# Patient Record
Sex: Female | Born: 1985 | Race: White | Hispanic: No | Marital: Married | Smoking: Current every day smoker
Health system: Southern US, Community
[De-identification: ages and names within clinical notes are randomized; demographics above are authoritative.]

## PROBLEM LIST (undated history)

## (undated) DIAGNOSIS — D649 Anemia, unspecified: Secondary | ICD-10-CM

## (undated) DIAGNOSIS — B977 Papillomavirus as the cause of diseases classified elsewhere: Secondary | ICD-10-CM

## (undated) DIAGNOSIS — M25519 Pain in unspecified shoulder: Secondary | ICD-10-CM

## (undated) DIAGNOSIS — C801 Malignant (primary) neoplasm, unspecified: Secondary | ICD-10-CM

## (undated) DIAGNOSIS — F419 Anxiety disorder, unspecified: Secondary | ICD-10-CM

## (undated) DIAGNOSIS — F99 Mental disorder, not otherwise specified: Secondary | ICD-10-CM

## (undated) HISTORY — PX: TUBAL LIGATION: SHX77

## (undated) HISTORY — DX: Papillomavirus as the cause of diseases classified elsewhere: B97.7

## (undated) HISTORY — DX: Anemia, unspecified: D64.9

## (undated) HISTORY — DX: Anxiety disorder, unspecified: F41.9

## (undated) HISTORY — DX: Mental disorder, not otherwise specified: F99

## (undated) HISTORY — DX: Malignant (primary) neoplasm, unspecified: C80.1

## (undated) HISTORY — DX: Pain in unspecified shoulder: M25.519

## (undated) HISTORY — PX: TOE SURGERY: SHX1073

---
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
## 2003-01-10 ENCOUNTER — Encounter: Payer: Self-pay | Admitting: Family Medicine

## 2003-01-10 ENCOUNTER — Ambulatory Visit (HOSPITAL_COMMUNITY): Admission: RE | Admit: 2003-01-10 | Discharge: 2003-01-10 | Payer: Self-pay | Admitting: Family Medicine

## 2003-04-22 ENCOUNTER — Inpatient Hospital Stay (HOSPITAL_COMMUNITY): Admission: AD | Admit: 2003-04-22 | Discharge: 2003-04-22 | Payer: Self-pay | Admitting: Family Medicine

## 2003-04-28 ENCOUNTER — Inpatient Hospital Stay (HOSPITAL_COMMUNITY): Admission: AD | Admit: 2003-04-28 | Discharge: 2003-04-28 | Payer: Self-pay | Admitting: Family Medicine

## 2003-05-07 ENCOUNTER — Inpatient Hospital Stay (HOSPITAL_COMMUNITY): Admission: AD | Admit: 2003-05-07 | Discharge: 2003-05-12 | Payer: Self-pay | Admitting: Family Medicine

## 2003-05-08 ENCOUNTER — Encounter: Payer: Self-pay | Admitting: Family Medicine

## 2004-01-04 ENCOUNTER — Other Ambulatory Visit: Admission: RE | Admit: 2004-01-04 | Discharge: 2004-01-04 | Payer: Self-pay | Admitting: Family Medicine

## 2004-08-06 ENCOUNTER — Emergency Department (HOSPITAL_COMMUNITY): Admission: EM | Admit: 2004-08-06 | Discharge: 2004-08-06 | Payer: Self-pay | Admitting: Emergency Medicine

## 2008-12-11 ENCOUNTER — Encounter: Admission: RE | Admit: 2008-12-11 | Discharge: 2008-12-13 | Payer: Self-pay | Admitting: Family Medicine

## 2008-12-14 ENCOUNTER — Encounter: Admission: RE | Admit: 2008-12-14 | Discharge: 2009-03-14 | Payer: Self-pay | Admitting: Family Medicine

## 2009-01-24 ENCOUNTER — Ambulatory Visit (HOSPITAL_COMMUNITY): Admission: RE | Admit: 2009-01-24 | Discharge: 2009-01-24 | Payer: Self-pay | Admitting: Family Medicine

## 2009-02-22 ENCOUNTER — Ambulatory Visit (HOSPITAL_COMMUNITY): Admission: RE | Admit: 2009-02-22 | Discharge: 2009-02-22 | Payer: Self-pay | Admitting: Family Medicine

## 2009-09-12 ENCOUNTER — Emergency Department (HOSPITAL_COMMUNITY): Admission: EM | Admit: 2009-09-12 | Discharge: 2009-09-12 | Payer: Self-pay | Admitting: Emergency Medicine

## 2011-02-04 ENCOUNTER — Emergency Department (HOSPITAL_COMMUNITY)
Admission: EM | Admit: 2011-02-04 | Discharge: 2011-02-04 | Disposition: A | Discharge status: Home / Self Care | Payer: Self-pay | Attending: Emergency Medicine | Admitting: Emergency Medicine

## 2011-02-04 ENCOUNTER — Emergency Department (HOSPITAL_COMMUNITY): Payer: Self-pay

## 2011-02-04 DIAGNOSIS — W108XXA Fall (on) (from) other stairs and steps, initial encounter: Secondary | ICD-10-CM | POA: Insufficient documentation

## 2011-02-04 DIAGNOSIS — S40019A Contusion of unspecified shoulder, initial encounter: Secondary | ICD-10-CM | POA: Insufficient documentation

## 2011-02-04 DIAGNOSIS — Y92009 Unspecified place in unspecified non-institutional (private) residence as the place of occurrence of the external cause: Secondary | ICD-10-CM | POA: Insufficient documentation

## 2011-03-20 LAB — URINALYSIS, ROUTINE W REFLEX MICROSCOPIC
Hgb urine dipstick: NEGATIVE
Nitrite: NEGATIVE
Specific Gravity, Urine: 1.037 — ABNORMAL HIGH (ref 1.005–1.030)
Urobilinogen, UA: 2 mg/dL — ABNORMAL HIGH (ref 0.0–1.0)

## 2011-03-20 LAB — URINE MICROSCOPIC-ADD ON

## 2011-03-20 LAB — URINE CULTURE
Colony Count: NO GROWTH
Culture: NO GROWTH

## 2011-05-01 NOTE — Discharge Summary (Signed)
NAME:  Mandy Garcia, PAZ                  ACCOUNT NO.:  [id number]   MEDICAL RECORD NO.:  [id number]                   PATIENT TYPE:  INP   LOCATION:  9374                                 FACILITY:  [facility]   PHYSICIAN:  Magnus Sinning. Rice, M.D.              DATE OF BIRTH:  1986-11-26   DATE OF ADMISSION:  05/07/2003  DATE OF DISCHARGE:  05/12/2003                                 DISCHARGE SUMMARY   DISCHARGE DIAGNOSES:  1. Intrauterine pregnancy at 35 and six weeks, delivered.  2. Preeclampsia.  3. Spontaneous rupture of membranes.   DISCHARGE MEDICATIONS:  1. Ibuprofen 800 mg one p.o. t.i.d. with food #30 dispensed.  2. Ortho Tri-Cyclen start on Sunday, May 20, 2003.  3. Iron sulfate 325 mg one p.o. b.i.d. until postpartum checkup.   DISCHARGE INSTRUCTIONS:  1. The patient discharged to home with follow up at [facility] within the next four to five days and at six weeks     postpartum.  2. The patient instructed to call for headache, right upper quadrant pain,     or increased swelling.   HISTORY AND PHYSICAL:  This is a 25 year old G2 P1 at 35 weeks and three  days by an eight-week ultrasound who was seen in the office for a routine  visit and noted to have a nine-pound weight gain in the last 10 days with  bilateral ankle swelling.  Denied headache or right upper quadrant pain.  Urine showed no proteinuria.  The patient called with increased swelling  later in the day and was directed to the hospital where she was then  admitted.  This pregnancy was complicated by preterm contractions prior to  this admission but no significant cervical change.  Prenatal labs:  A  positive, antibody negative, RPR nonreactive, rubella immune, HB surface  antigen negative, HIV negative, Pap within normal limits, GC and chlamydia  were negative, MSAFP was negative, one-hour Glucola was 139, GBS was  negative on Apr 23, 2003.  OB history remarkable for in May 2002 a  female  child, 8 pounds 8 ounces at 38 weeks, NSVD, no complications in that  pregnancy and particularly no hypertension.  Family history with no  hypertension.  Medications were Zantac, prenatal vitamins, and iron.  Allergies:  None.  Social history:  No tobacco, married.  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  The patient was noted to be well-developed, well-nourished, in no acute  distress at her presentation to the hospital.  Her blood pressures were  151/96, 151/97, 145/86, and 137/104.  The remainder of her exam was normal  except she was noted to have 1+ edema bilaterally with 1+ DTRs, no clonus.  Her cervix was noted to be a loose 1 cm, soft, long, high, and the baby in a  vertex presentation.  Contractions were mild every three to seven minutes  and the heart tracing was noted  to be reactive.  Laboratories at that time  showed a uric acid of 4.7, LDH 133, SGOT 12, and SGPT less than 19.  The UA  was noted to have 100 mg of protein and her hemoglobin was 9.8 with  platelets 264.  Decision was made to admit the patient for observation due  to her elevated blood pressure and her swelling.  She also developed a mild  headache at the time of admission.   HOSPITAL COURSE:  The patient had Stadol and Ambien overnight.  Headache  resolved but she noticed the increase of contractions.  Blood pressures  overnight were relatively well-controlled with ranging anywhere from 135/81  up to 151/94.  A 24-hour urine was in progress so it was decided to wait  until it returned for disposition.  An OB teaching service consult was  obtained on May 26.  The patient was noted to still have labile blood  pressures with blood pressures ranging from 152/92 all the way up to  170/105.  The 24-hour urine was not back at that time so it was decided to  observe her overnight again.  On the morning of May 10, 2003 the patient had  spontaneous rupture of membranes with copious pooling  and positive  Nitrazine.  She denied at that time a headache but her blood pressures  trended up higher, with readings from 136/91 all the way up to 188/117.  She  was subsequently started on magnesium sulfate with a 4 gram bolus and 2  grams/hour and allowed to labor.  The patient had a spontaneous delivery of  a viable female at 10:15 a.m. with Apgars of 8 and 8 who had a slight oxygen  requirement and therefore was transferred to the NICU.  Postpartum the  patient recovered well with decrease in her blood pressures and diuresis  obtained.  She was maintained in the AICU for an additional evening after  her magnesium was discontinued because of a slight headache and an episode  of nausea and vomiting.  On the morning of May 12, 2003 she was found to be  much better with no headache, no further nausea.  Blood pressures were well  controlled in the 140 to 68 range and nice diuresis was maintained.  She was  therefore discharged home with instructions as listed above.                                               Magnus Sinning. Rice, M.D.    KMR/MEDQ  D:  05/12/2003  T:  05/12/2003  Job:  [id number]

## 2011-06-26 ENCOUNTER — Emergency Department (HOSPITAL_COMMUNITY): Payer: Medicaid Other

## 2011-06-26 ENCOUNTER — Encounter: Payer: Self-pay | Admitting: Emergency Medicine

## 2011-06-26 ENCOUNTER — Emergency Department (HOSPITAL_COMMUNITY)
Admission: EM | Admit: 2011-06-26 | Discharge: 2011-06-26 | Disposition: A | Discharge status: Home / Self Care | Payer: Medicaid Other | Attending: Emergency Medicine | Admitting: Emergency Medicine

## 2011-06-26 DIAGNOSIS — N12 Tubulo-interstitial nephritis, not specified as acute or chronic: Secondary | ICD-10-CM | POA: Insufficient documentation

## 2011-06-26 DIAGNOSIS — F17200 Nicotine dependence, unspecified, uncomplicated: Secondary | ICD-10-CM | POA: Insufficient documentation

## 2011-06-26 DIAGNOSIS — R509 Fever, unspecified: Secondary | ICD-10-CM

## 2011-06-26 LAB — COMPREHENSIVE METABOLIC PANEL
AST: 26 U/L (ref 0–37)
Albumin: 3.6 g/dL (ref 3.5–5.2)
BUN: 9 mg/dL (ref 6–23)
Chloride: 104 mEq/L (ref 96–112)
Creatinine, Ser: 0.69 mg/dL (ref 0.50–1.10)
Total Bilirubin: 0.2 mg/dL — ABNORMAL LOW (ref 0.3–1.2)
Total Protein: 7.4 g/dL (ref 6.0–8.3)

## 2011-06-26 LAB — PREGNANCY, URINE: Preg Test, Ur: NEGATIVE

## 2011-06-26 LAB — URINALYSIS, ROUTINE W REFLEX MICROSCOPIC
Glucose, UA: NEGATIVE mg/dL
Ketones, ur: NEGATIVE mg/dL
Nitrite: POSITIVE — AB
Protein, ur: 100 mg/dL — AB
pH: 5.5 (ref 5.0–8.0)

## 2011-06-26 LAB — URINE MICROSCOPIC-ADD ON

## 2011-06-26 LAB — DIFFERENTIAL
Basophils Absolute: 0 10*3/uL (ref 0.0–0.1)
Eosinophils Absolute: 0 10*3/uL (ref 0.0–0.7)
Lymphocytes Relative: 11 % — ABNORMAL LOW (ref 12–46)
Lymphs Abs: 1.1 10*3/uL (ref 0.7–4.0)
Neutrophils Relative %: 77 % (ref 43–77)

## 2011-06-26 LAB — CBC
Platelets: 273 10*3/uL (ref 150–400)
RBC: 4.82 MIL/uL (ref 3.87–5.11)
RDW: 17.8 % — ABNORMAL HIGH (ref 11.5–15.5)
WBC: 10 10*3/uL (ref 4.0–10.5)

## 2011-06-26 LAB — POCT PREGNANCY, URINE: Preg Test, Ur: NEGATIVE

## 2011-06-26 MED ORDER — MORPHINE SULFATE 4 MG/ML IJ SOLN
4.0000 mg | Freq: Once | INTRAMUSCULAR | Status: AC
  Administered 2011-06-26: 4 mg via INTRAVENOUS
  Filled 2011-06-26: qty 1

## 2011-06-26 MED ORDER — SODIUM CHLORIDE 0.9 % IV SOLN: 999.0000 mL | INTRAVENOUS | Status: DC

## 2011-06-26 MED ORDER — DEXTROSE 5 % IV SOLN
1.0000 g | Freq: Once | INTRAVENOUS | Status: AC
  Administered 2011-06-26: 1 g via INTRAVENOUS
  Filled 2011-06-26: qty 1

## 2011-06-26 MED ORDER — CIPROFLOXACIN HCL 500 MG PO TABS: 500.0000 mg | ORAL_TABLET | Freq: Two times a day (BID) | ORAL | Status: AC

## 2011-06-26 MED ORDER — IOHEXOL 300 MG/ML  SOLN
100.0000 mL | Freq: Once | INTRAMUSCULAR | Status: AC | PRN
  Administered 2011-06-26: 100 mL via INTRAVENOUS

## 2011-06-26 MED ORDER — OXYCODONE-ACETAMINOPHEN 5-325 MG PO TABS: 2.0000 | ORAL_TABLET | ORAL | Status: AC | PRN

## 2011-06-26 MED ORDER — ONDANSETRON HCL 4 MG/2ML IJ SOLN
4.0000 mg | Freq: Once | INTRAMUSCULAR | Status: AC
  Administered 2011-06-26: 4 mg via INTRAVENOUS
  Filled 2011-06-26: qty 2

## 2011-06-26 NOTE — ED Provider Notes (Cosign Needed)
History     Chief Complaint  Patient presents with  . Abdominal Pain   Patient is a 25 y.o. female presenting with abdominal pain. The history is provided by the patient.  Abdominal Pain The primary symptoms of the illness include abdominal pain and nausea. The primary symptoms of the illness do not include fever, shortness of breath, vomiting, diarrhea, dysuria, vaginal discharge or vaginal bleeding. Episode onset: five days ago. The problem has been gradually worsening.  The abdominal pain is located in the RLQ. The abdominal pain does not radiate. The abdominal pain is relieved by nothing. The abdominal pain is exacerbated by deep breathing.  The patient has not had a change in bowel habit. Additional symptoms associated with the illness include anorexia. Symptoms associated with the illness do not include chills or constipation.    History reviewed. No pertinent past medical history.  Past Surgical History  Procedure Date  . Tubal ligation     History reviewed. No pertinent family history.  History  Substance Use Topics  . Smoking status: Current Everyday Smoker    Types: Cigarettes  . Smokeless tobacco: Not on file  . Alcohol Use: No    OB History    Grav Para Term Preterm Abortions TAB SAB Ect Mult Living                  Review of Systems  Constitutional: Positive for appetite change. Negative for fever and chills.  Respiratory: Negative for cough and shortness of breath.   Cardiovascular: Negative for chest pain.  Gastrointestinal: Positive for nausea, abdominal pain and anorexia. Negative for vomiting, diarrhea and constipation.  Genitourinary: Negative for dysuria, flank pain, vaginal bleeding and vaginal discharge.    Physical Exam  BP 136/75  Pulse 116  Temp(Src) 100.3 F (37.9 C) (Oral)  Resp 20  Ht 5\' 2"  (1.575 m)  Wt 140 lb (63.504 kg)  BMI 25.61 kg/m2  SpO2 99%  LMP 05/15/2011  Physical Exam  Nursing note and vitals reviewed. Constitutional:  She appears well-developed.  HENT:  Head: Normocephalic.  Eyes: EOM are normal.  Neck: Normal range of motion.  Cardiovascular: Tachycardia present.   No murmur heard. Pulmonary/Chest: No respiratory distress. She has no wheezes. She has no rales.  Abdominal: Soft. Normal appearance. She exhibits no distension and no mass. There is tenderness in the right lower quadrant. There is rebound, guarding and tenderness at McBurney's point. There is no rigidity and negative Murphy's sign.  Neurological: She is alert.  Skin: Skin is warm.  Psychiatric: She has a normal mood and affect.    ED Course  Procedures  MDM   Notified pt of test results including neg appendicitis. + findings for uti. Need for iv abx and then eventual discharge.  6:32 PM abd pain for several days with peritoneal signs.  No vag discharge. Not pregnant.  Ct neg appy.  UA c/w infection and ct supports this dx.  Pt is young and healthy.  Not toxic. Not hypotensive.  Iv abxs started in ed.  Pain controlled.       Nicholes Stairs, MD 06/26/11 [phone]

## 2011-06-26 NOTE — ED Notes (Signed)
Lower abd pain since Monday, r side started hurting last night with cold chills this am. Nausea. Denies v/d. Denies vaginal d/c. denie surinary sx's. Last normal bm yesterday. Denies cough/congestion

## 2012-04-27 ENCOUNTER — Encounter: Payer: Self-pay | Admitting: Orthopedic Surgery

## 2012-04-27 ENCOUNTER — Ambulatory Visit (INDEPENDENT_AMBULATORY_CARE_PROVIDER_SITE_OTHER): Payer: Medicaid Other | Admitting: Orthopedic Surgery

## 2012-04-27 VITALS — BP 90/60 | Ht 62.0 in | Wt 166.0 lb

## 2012-04-27 DIAGNOSIS — S62309A Unspecified fracture of unspecified metacarpal bone, initial encounter for closed fracture: Secondary | ICD-10-CM

## 2012-04-27 DIAGNOSIS — S6290XA Unspecified fracture of unspecified wrist and hand, initial encounter for closed fracture: Secondary | ICD-10-CM | POA: Insufficient documentation

## 2012-04-27 MED ORDER — HYDROCODONE-ACETAMINOPHEN 5-500 MG PO TABS: 1.0000 | ORAL_TABLET | Freq: Four times a day (QID) | ORAL | Status: DC | PRN

## 2012-04-27 NOTE — Patient Instructions (Signed)
Keep  Cast dry   Do not get wet   If it gets wet dry with a hair dryer on low setting and call the office   
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
## 2012-05-25 ENCOUNTER — Encounter: Payer: Self-pay | Admitting: Orthopedic Surgery

## 2012-05-25 ENCOUNTER — Ambulatory Visit: Payer: Medicaid Other | Admitting: Orthopedic Surgery

## 2012-05-30 ENCOUNTER — Ambulatory Visit: Payer: Medicaid Other | Admitting: Orthopedic Surgery

## 2014-03-06 LAB — HM COLONOSCOPY: HM Colonoscopy: NEGATIVE

## 2014-03-09 ENCOUNTER — Encounter: Payer: Self-pay | Admitting: *Deleted

## 2014-03-14 ENCOUNTER — Encounter (HOSPITAL_COMMUNITY): Payer: Self-pay

## 2014-03-14 ENCOUNTER — Encounter (HOSPITAL_COMMUNITY): Payer: Medicaid Other | Attending: Hematology and Oncology

## 2014-03-14 VITALS — BP 134/81 | HR 74 | Temp 98.0°F | Resp 18 | Wt 154.9 lb

## 2014-03-14 DIAGNOSIS — M25519 Pain in unspecified shoulder: Secondary | ICD-10-CM | POA: Insufficient documentation

## 2014-03-14 DIAGNOSIS — Z87828 Personal history of other (healed) physical injury and trauma: Secondary | ICD-10-CM | POA: Insufficient documentation

## 2014-03-14 DIAGNOSIS — F3289 Other specified depressive episodes: Secondary | ICD-10-CM | POA: Insufficient documentation

## 2014-03-14 DIAGNOSIS — F329 Major depressive disorder, single episode, unspecified: Secondary | ICD-10-CM | POA: Insufficient documentation

## 2014-03-14 DIAGNOSIS — Z881 Allergy status to other antibiotic agents status: Secondary | ICD-10-CM | POA: Insufficient documentation

## 2014-03-14 DIAGNOSIS — Z885 Allergy status to narcotic agent status: Secondary | ICD-10-CM | POA: Insufficient documentation

## 2014-03-14 DIAGNOSIS — D509 Iron deficiency anemia, unspecified: Secondary | ICD-10-CM

## 2014-03-14 LAB — IRON AND TIBC
Iron: 11 ug/dL — ABNORMAL LOW (ref 42–135)
Saturation Ratios: 3 % — ABNORMAL LOW (ref 20–55)
TIBC: 417 ug/dL (ref 250–470)
UIBC: 406 ug/dL — AB (ref 125–400)

## 2014-03-14 LAB — COMPREHENSIVE METABOLIC PANEL
ALBUMIN: 3.8 g/dL (ref 3.5–5.2)
ALK PHOS: 86 U/L (ref 39–117)
ALT: 29 U/L (ref 0–35)
AST: 29 U/L (ref 0–37)
BILIRUBIN TOTAL: 0.2 mg/dL — AB (ref 0.3–1.2)
BUN: 8 mg/dL (ref 6–23)
CO2: 26 mEq/L (ref 19–32)
Calcium: 9.3 mg/dL (ref 8.4–10.5)
Chloride: 102 mEq/L (ref 96–112)
Creatinine, Ser: 0.7 mg/dL (ref 0.50–1.10)
GFR calc Af Amer: >90 mL/min (ref >90)
GFR calc non Af Amer: >90 mL/min (ref >90)
GLUCOSE: 90 mg/dL (ref 70–99)
POTASSIUM: 4 meq/L (ref 3.7–5.3)
SODIUM: 139 meq/L (ref 137–147)
Total Protein: 7.2 g/dL (ref 6.0–8.3)

## 2014-03-14 LAB — CBC WITH DIFFERENTIAL/PLATELET
Basophils Absolute: 0 10*3/uL (ref 0.0–0.1)
Basophils Relative: 0 % (ref 0–1)
EOS PCT: 1 % (ref 0–5)
Eosinophils Absolute: 0.1 10*3/uL (ref 0.0–0.7)
HEMATOCRIT: 36 % (ref 36.0–46.0)
HEMOGLOBIN: 11.6 g/dL — AB (ref 12.0–15.0)
LYMPHS ABS: 2.3 10*3/uL (ref 0.7–4.0)
LYMPHS PCT: 31 % (ref 12–46)
MCH: 24.1 pg — ABNORMAL LOW (ref 26.0–34.0)
MCHC: 32.2 g/dL (ref 30.0–36.0)
MCV: 74.7 fL — AB (ref 78.0–100.0)
MONO ABS: 0.6 10*3/uL (ref 0.1–1.0)
Monocytes Relative: 8 % (ref 3–12)
Neutro Abs: 4.4 10*3/uL (ref 1.7–7.7)
Neutrophils Relative %: 59 % (ref 43–77)
Platelets: 267 10*3/uL (ref 150–400)
RBC: 4.82 MIL/uL (ref 3.87–5.11)
RDW: 16.4 % — ABNORMAL HIGH (ref 11.5–15.5)
WBC: 7.4 10*3/uL (ref 4.0–10.5)

## 2014-03-14 LAB — RETICULOCYTES
RBC.: 4.82 MIL/uL (ref 3.87–5.11)
RETIC CT PCT: 1.3 % (ref 0.4–3.1)
Retic Count, Absolute: 62.7 10*3/uL (ref 19.0–186.0)

## 2014-03-14 NOTE — Progress Notes (Signed)
Mandy Garcia, M.D.  NEW PATIENT EVALUATION   Name: Mandy Garcia Date: 03/14/2014 MRN: [id number] DOB: 09/08/1986  PCP: Mandy Percy, MD   REFERRING PHYSICIAN: Rory Percy, MD  REASON FOR REFERRAL: Iron deficiency     HISTORY OF PRESENT ILLNESS:Mandy Garcia is a 28 y.o. female who is referred by her family physician because of iron deficiency. She is gravida 3 para 3 AB 02 has very minimal menstrual bleeding for about 2 days every month. She does crave ice and has noticed brittleness of her fingernails. She denies any cracking of her lips. Appetite has been good. 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 She injured her right shoulder about a year ago, replaced in herself, but still has chronic pain with difficulty raising the right upper extremity. She's had no swelling or evidence of hyperthermia in limb. She denies any nausea, vomiting, melena, hematochezia, hematuria, epistaxis, or hemoptysis. Urine is normal color. She denies any easy satiety, lymphadenopathy, skin rash, headache, or seizures.  Marland Kitchen   PAST MEDICAL HISTORY:  has a past medical history of Anxiety and Shoulder pain.     PAST SURGICAL HISTORY: Past Surgical History  Procedure Laterality Date  . Tubal ligation       CURRENT MEDICATIONS: has a current medication list which includes the following prescription(s): citalopram, diclofenac, flintstones complete, trazodone, and hydrocodone-acetaminophen.   ALLERGIES: Doxycycline calcium; Doxycycline monohydrate; and Tramadol   SOCIAL HISTORY:  reports that she has been smoking Cigarettes.  She has been smoking about 1.00 pack per day. She does not have any smokeless tobacco history on file. She reports that she does not drink alcohol or use illicit drugs.   FAMILY HISTORY: family history is not on file.    REVIEW OF SYSTEMS:    Other than that discussed above is noncontributory.    PHYSICAL EXAM:  weight is 154 lb 14.4 oz (70.262 kg). Her oral temperature is 98 F (36.7 C). Her blood pressure is 134/81 and her pulse is 74. Her respiration is 18 and oxygen saturation is 99%.    GENERAL:alert, no distress and comfortable. Pale and thin. SKIN: skin color, texture, turgor are normal, no rashes or significant lesions EYES: normal, Conjunctiva are pink and non-injected, sclera clear OROPHARYNX:no exudate, no erythema and lips, buccal mucosa, and tongue normal  NECK: supple, thyroid normal size, non-tender, without nodularity CHEST: Normal AP diameter with no breast masses. LYMPH:  no palpable lymphadenopathy in the cervical, axillary or inguinal LUNGS: clear to auscultation and percussion with normal breathing effort HEART: regular rate & rhythm and no murmurs ABDOMEN:abdomen soft, non-tender and normal bowel sounds MUSCULOSKELETALl:no cyanosis of digits, no clubbing or edema. Decreased range of motion of the right shoulder with tenderness but no evidence of deformity. Pulses are normal and right upper extremity.  NEURO: alert & oriented x 3 with fluent speech, no focal motor/sensory deficits    LABORATORY DATA:   03/01/2014:  Ferritin less than 1, iron saturation 4%  Abstract on 03/09/2014  Component Date Value Ref Range Status  . HM Colonoscopy 03/06/2014 ASCUS, negative HPV   Final    Urinalysis    Component Value Date/Time   COLORURINE YELLOW 06/26/2011 1538   APPEARANCEUR HAZY* 06/26/2011 1538   LABSPEC 1.025 06/26/2011 1538   PHURINE 5.5 06/26/2011 1538   GLUCOSEU NEGATIVE 06/26/2011 1538   HGBUR MODERATE* 06/26/2011 1538  BILIRUBINUR NEGATIVE 06/26/2011 1538   KETONESUR NEGATIVE 06/26/2011 1538   PROTEINUR 100* 06/26/2011 1538   UROBILINOGEN 0.2 06/26/2011 1538   NITRITE POSITIVE* 06/26/2011 1538   LEUKOCYTESUR LARGE* 06/26/2011 1538      @RADIOGRAPHY : No results found.  PATHOLOGY: Peripheral smear  reveals microcytic hypochromic cells.   IMPRESSION:  #1. Iron deficiency with no gross evidence of blood loss but status post 3 pregnancies for which she did not take prenatal vitamins, possible malabsorption.  #2. Right shoulder dislocation with persistent pain. #3. Depression, on treatment with trazodone and citalopram.   PLAN:  #1. Intravenous Feraheme on 03/21/2014. #2. Workup for possible malabsorption including antibiotics after antibody and anti-parietal cell antibody. #3. Followup in 6 weeks with CBC and ferritin.  I appreciate the opportunity sharing in her care.   Mandy Bradford, MD 03/14/2014 3:06 PM

## 2014-03-14 NOTE — Patient Instructions (Signed)
[facility] Discharge Instructions  RECOMMENDATIONS MADE BY THE CONSULTANT AND ANY TEST RESULTS WILL BE SENT TO YOUR REFERRING PHYSICIAN.  Intravenous Feraheme on 03/21/2014.   Workup for possible malabsorption including antibiotics after antibody and anti-parietal cell antibody.   Followup in 6 weeks with CBC and ferritin.   Thank you for choosing [facility] to provide your oncology and hematology care.  To afford each patient quality time with our providers, please arrive at least 15 minutes before your scheduled appointment time.  With your help, our goal is to use those 15 minutes to complete the necessary work-up to ensure our physicians have the information they need to help with your evaluation and healthcare recommendations.    Effective January 1st, 2014, we ask that you re-schedule your appointment with our physicians should you arrive 10 or more minutes late for your appointment.  We strive to give you quality time with our providers, and arriving late affects you and other patients whose appointments are after yours.    Again, thank you for choosing [facility].  Our hope is that these requests will decrease the amount of time that you wait before being seen by our physicians.       _____________________________________________________________  Should you have questions after your visit to [facility], please contact our office at [phone] between the hours of 8:30 a.m. and 5:00 p.m.  Voicemails left after 4:30 p.m. will not be returned until the following business day.  For prescription refill requests, have your pharmacy contact our office with your prescription refill request.

## 2014-03-14 NOTE — Progress Notes (Signed)
Mandy Garcia's reason for visit today are for labs as scheduled per MD orders.  Venipuncture performed with a 23 gauge butterfly needle to R Antecubital.  Mandy Garcia tolerated venipuncture well and without incident; questions were answered and patient was discharged.

## 2014-03-14 NOTE — Addendum Note (Signed)
Addended by: Jeannette How D on: 03/14/2014 03:55 PM   Modules accepted: Orders

## 2014-03-15 LAB — FERRITIN: FERRITIN: 5 ng/mL — AB (ref 10–291)

## 2014-03-16 LAB — HEMOGLOBINOPATHY EVALUATION
HGB F QUANT: 0 % (ref 0.0–2.0)
HGB S QUANTITAION: 0 %
Hemoglobin Other: 0 %
Hgb A2 Quant: 2.3 % (ref 2.2–3.2)
Hgb A: 97.7 % (ref 96.8–97.8)

## 2014-03-19 ENCOUNTER — Encounter: Payer: Self-pay | Admitting: Obstetrics and Gynecology

## 2014-03-19 ENCOUNTER — Ambulatory Visit (INDEPENDENT_AMBULATORY_CARE_PROVIDER_SITE_OTHER): Payer: Medicaid Other | Admitting: Obstetrics and Gynecology

## 2014-03-19 VITALS — BP 112/60 | Ht 62.0 in | Wt 156.0 lb

## 2014-03-19 DIAGNOSIS — R85619 Unspecified abnormal cytological findings in specimens from anus: Secondary | ICD-10-CM

## 2014-03-19 DIAGNOSIS — R87611 Atypical squamous cells cannot exclude high grade squamous intraepithelial lesion on cytologic smear of cervix (ASC-H): Secondary | ICD-10-CM

## 2014-03-19 DIAGNOSIS — Z3202 Encounter for pregnancy test, result negative: Secondary | ICD-10-CM

## 2014-03-19 LAB — POCT URINE PREGNANCY: Preg Test, Ur: NEGATIVE

## 2014-03-19 NOTE — Progress Notes (Signed)
SUBJECTIVE: Patient presents for evaluation of abnormal pap smear (ASCUS with negative high risk HPV).  ASSESSMENT/PLAN: Given that pt is 28 years old, with ASCUS and negative high risk HPV, colposcopy is not indicated at this time per current ASCCP guidelines. Instead, the preferred algorithm for continued screening is repeat cotesting in 3 years. Counseled patient that she can repeat her pap with HPV test in 3 years. This result confirmed on App from [email or web address]. This note was scribed for Dr. Glo Herring by Chrisandra Netters, MD (Family Medicine PGY-2).  Will send note to [facility] re ; future paps

## 2014-03-19 NOTE — Patient Instructions (Signed)
Abnormal Pap Test Information During a Pap test, the cells on the surface of your cervix are checked to see if they look normal, abnormal, or if they show signs of having been altered by a certain type of virus called human papillomavirus, or HPV. Cervical cells that have been affected by HPV are called dysplasia. Dysplasia is not cancer, but describes abnormal cells found on the surface of the cervix. Depending on the degree of dysplasia, some of the cells may be considered pre-cancerous and may turn into cancer over time if follow up with a caregiver is delayed.  WHAT DOES AN ABNORMAL PAP TEST MEAN? Having an abnormal pap test does not mean that you have cancer. However, certain types of abnormal pap tests can be a sign that a person is at a higher risk of developing cancer. Your caregiver will want to do other tests to find out more about the abnormal cells. Your abnormal Pap test results could show:   Small and uncertain changes that should be carefully watched.   Cervical dysplasia that has caused mild changes and can be followed over time.  Cervical dysplasia that is more severe and needs to be followed and treated to ensure the problem goes away.  Cancer.  When severe cervical dysplasia is found and treated early, it rarely will grow into cancer.  WHAT WILL BE DONE ABOUT MY ABNORMAL PAP TEST?  A colposcopy may be needed. This is a procedure where your cervix is examined using light and magnification.  A small tissue sample of your cervix (biopsy) may need to be removed and then examined. This is often performed if there are areas that appear infected.  A sample of cells from the cervical canal may be removed with either a small brush or scraping instrument (curette). Based on the results of the procedures above, some caregivers may recommend either cryotherapy of the cervix or a surgical LEEP where a portion of the cervix is removed. LEEP is short for "loop electrical excisional  procedure." Rarely, a caregiver may recommend a cone biopsy.This is a procedure where a small, cone-shaped sample of your cervix is taken out. The part that is taken out is the area where the abnormal cells are.  WHAT IF I HAVE A DYSPLASIA OR A CANCER? You may be referred to a specialist. Radiation may also be a treatment for more advanced cancer. Having a hysterectomy is the last treatment option for dysplasia, but it is a more common treatment for someone with cancer. All treatment options will be discussed with you by your caregiver. WHAT SHOULD YOU DO AFTER BEING TREATED? If you have had an abnormal pap test, you should continue to have regular pap tests and check-ups as directed by your caregiver. Your cervical problem will be carefully watched so it does not get worse. Also, your caregiver can watch for, and treat, any new problems that may come up. Document Released: 03/17/2011 Document Revised: 03/27/2013 Document Reviewed: 11/26/2011 [employer] Patient Information 2014 [employer].  

## 2014-03-21 ENCOUNTER — Encounter (HOSPITAL_BASED_OUTPATIENT_CLINIC_OR_DEPARTMENT_OTHER): Payer: Medicaid Other

## 2014-03-21 VITALS — BP 124/73 | HR 73 | Temp 97.1°F | Resp 18

## 2014-03-21 DIAGNOSIS — D509 Iron deficiency anemia, unspecified: Secondary | ICD-10-CM

## 2014-03-21 MED ORDER — SODIUM CHLORIDE 0.9 % IV SOLN
1020.0000 mg | Freq: Once | INTRAVENOUS | Status: AC
  Administered 2014-03-21: 1020 mg via INTRAVENOUS
  Filled 2014-03-21: qty 34

## 2014-03-21 MED ORDER — SODIUM CHLORIDE 0.9 % IV SOLN
Freq: Once | INTRAVENOUS | Status: AC
  Administered 2014-03-21: 11:00:00 via INTRAVENOUS

## 2014-03-21 MED ORDER — SODIUM CHLORIDE 0.9 % IJ SOLN
10.0000 mL | INTRAMUSCULAR | Status: DC | PRN
  Administered 2014-03-21: 10 mL

## 2014-05-02 ENCOUNTER — Encounter (HOSPITAL_COMMUNITY): Payer: Self-pay

## 2014-05-02 ENCOUNTER — Encounter (HOSPITAL_BASED_OUTPATIENT_CLINIC_OR_DEPARTMENT_OTHER): Payer: Medicaid Other

## 2014-05-02 ENCOUNTER — Encounter (HOSPITAL_COMMUNITY): Payer: Medicaid Other | Attending: Hematology and Oncology

## 2014-05-02 VITALS — BP 122/81 | HR 86 | Temp 97.7°F | Resp 20 | Wt 153.0 lb

## 2014-05-02 DIAGNOSIS — S060X9A Concussion with loss of consciousness of unspecified duration, initial encounter: Secondary | ICD-10-CM

## 2014-05-02 DIAGNOSIS — R55 Syncope and collapse: Secondary | ICD-10-CM | POA: Insufficient documentation

## 2014-05-02 DIAGNOSIS — M25519 Pain in unspecified shoulder: Secondary | ICD-10-CM

## 2014-05-02 DIAGNOSIS — D509 Iron deficiency anemia, unspecified: Secondary | ICD-10-CM

## 2014-05-02 DIAGNOSIS — F3289 Other specified depressive episodes: Secondary | ICD-10-CM

## 2014-05-02 DIAGNOSIS — F329 Major depressive disorder, single episode, unspecified: Secondary | ICD-10-CM

## 2014-05-02 DIAGNOSIS — S060XAA Concussion with loss of consciousness status unknown, initial encounter: Secondary | ICD-10-CM | POA: Insufficient documentation

## 2014-05-02 LAB — CBC WITH DIFFERENTIAL/PLATELET
BASOS PCT: 0 % (ref 0–1)
Basophils Absolute: 0 10*3/uL (ref 0.0–0.1)
EOS ABS: 0.1 10*3/uL (ref 0.0–0.7)
EOS PCT: 1 % (ref 0–5)
HCT: 45.5 % (ref 36.0–46.0)
Hemoglobin: 15.3 g/dL — ABNORMAL HIGH (ref 12.0–15.0)
LYMPHS PCT: 26 % (ref 12–46)
Lymphs Abs: 2.3 10*3/uL (ref 0.7–4.0)
MCH: 27.3 pg (ref 26.0–34.0)
MCHC: 33.6 g/dL (ref 30.0–36.0)
MCV: 81.1 fL (ref 78.0–100.0)
Monocytes Absolute: 0.5 10*3/uL (ref 0.1–1.0)
Monocytes Relative: 6 % (ref 3–12)
Neutro Abs: 5.9 10*3/uL (ref 1.7–7.7)
Neutrophils Relative %: 67 % (ref 43–77)
Platelets: 236 10*3/uL (ref 150–400)
RBC: 5.61 MIL/uL — AB (ref 3.87–5.11)
RDW: 22.3 % — ABNORMAL HIGH (ref 11.5–15.5)
WBC: 8.8 10*3/uL (ref 4.0–10.5)

## 2014-05-02 LAB — FERRITIN: Ferritin: 77 ng/mL (ref 10–291)

## 2014-05-02 NOTE — Progress Notes (Signed)
Mandy Garcia presented for labwork. Labs per MD order drawn via Peripheral Line 23 gauge needle inserted in right anterior forearm.  Good blood return present. Procedure without incident.  Needle removed intact. Patient tolerated procedure well.

## 2014-05-02 NOTE — Progress Notes (Signed)
[facility]  OFFICE PROGRESS NOTE  Rory Percy, MD [address] [place] [address]  DIAGNOSIS: Iron deficiency anemia - Plan: CBC with Differential, Ferritin  Concussion, 3 episodes the last 2 years ago - Plan: MR Brain W Wo Contrast, Consult to neurology  Syncope - Plan: MR Brain W Wo Contrast, Consult to neurology  Chief Complaint  Patient presents with  . Iron deficiency  . Follow-up    CURRENT THERAPY: Intravenous Feraheme 03/21/2014.   INTERVAL HISTORY: Mandy Garcia 28 y.o. female returns for followup of iron deficiency anemia, status post intravenous Feraheme administration on 03/21/2014. Patient has been having "blackouts "since April 17. This can occur at any time but particularly when she is exposed to a lot of heat. One episode occurred in the car while driving with her husband. She does stay unconscious for 3-5 minutes. Episodes are there is no history of migraine syndrome.  not associated with palpitations or headache. She has had discomfort in the right posterior auricular area. She denies any allergies, nasal drip, or earache. She's had no hearing deficit. She denies any focal weakness. She also denies any peripheral paresthesias. There is no history of migraine syndrome. She tolerated Feraheme infusion well but did have headache lasting for 3 days after. Her last menstrual period was on 04/13/2014 lasting only one day.  MEDICAL HISTORY: Past Medical History  Diagnosis Date  . Anxiety   . Shoulder pain   . Anemia     INTERIM HISTORY: has Hand fracture; Iron deficiency anemia; Abnormal Pap smear of anus; Concussion, 3 episodes the last 2 years ago; and Syncope on her problem list.    ALLERGIES:  is allergic to doxycycline calcium; doxycycline monohydrate; and tramadol.  MEDICATIONS: has a current medication list which includes the following prescription(s): citalopram, diclofenac, flintstones complete,  hydrocodone-acetaminophen, and trazodone.  SURGICAL HISTORY:  Past Surgical History  Procedure Laterality Date  . Tubal ligation      FAMILY HISTORY: family history is not on file.  SOCIAL HISTORY:  reports that she has been smoking Cigarettes.  She has been smoking about 1.00 pack per day. She does not have any smokeless tobacco history on file. She reports that she does not drink alcohol or use illicit drugs.  REVIEW OF SYSTEMS:  Other than that discussed above is noncontributory.  PHYSICAL EXAMINATION: ECOG PERFORMANCE STATUS: 1 - Symptomatic but completely ambulatory  Blood pressure 122/81, pulse 86, temperature 97.7 F (36.5 C), temperature source Oral, resp. rate 20, weight 153 lb (69.4 kg).  GENERAL:alert, no distress and comfortable SKIN: skin color, texture, turgor are normal, no rashes or significant lesions EYES: PERLA; Conjunctiva are pink and non-injected, sclera clear. Fundi normal. SINUSES: No redness or tenderness over maxillary or ethmoid sinuses OROPHARYNX:no exudate, no erythema on lips, buccal mucosa, or tongue. NECK: supple, thyroid normal size, non-tender, without nodularity. No masses. No carotid bruits. CHEST: Normal AP diameter with no breast masses. LYMPH:  no palpable lymphadenopathy in the cervical, axillary or inguinal LUNGS: clear to auscultation and percussion with normal breathing effort HEART: regular rate & rhythm and no murmurs. ABDOMEN:abdomen soft, non-tender and normal bowel sounds MUSCULOSKELETAL:no cyanosis of digits and no clubbing. Range of motion normal.  NEURO: alert & oriented x 3 with fluent speech, no focal motor/sensory deficits   LABORATORY DATA: Infusion on 05/02/2014  Component Date Value Ref Range Status  . WBC 05/02/2014 8.8  4.0 - 10.5 K/uL Final  .  RBC 05/02/2014 5.61* 3.87 - 5.11 MIL/uL Final  . Hemoglobin 05/02/2014 15.3* 12.0 - 15.0 g/dL Final  . HCT 05/02/2014 45.5  36.0 - 46.0 % Final  . MCV 05/02/2014 81.1  78.0 -  100.0 fL Final  . MCH 05/02/2014 27.3  26.0 - 34.0 pg Final  . MCHC 05/02/2014 33.6  30.0 - 36.0 g/dL Final  . RDW 05/02/2014 22.3* 11.5 - 15.5 % Final  . Platelets 05/02/2014 236  150 - 400 K/uL Final  . Neutrophils Relative % 05/02/2014 PENDING  43 - 77 % Incomplete  . Neutro Abs 05/02/2014 PENDING  1.7 - 7.7 K/uL Incomplete  . Band Neutrophils 05/02/2014 PENDING  0 - 10 % Incomplete  . Lymphocytes Relative 05/02/2014 PENDING  12 - 46 % Incomplete  . Lymphs Abs 05/02/2014 PENDING  0.7 - 4.0 K/uL Incomplete  . Monocytes Relative 05/02/2014 PENDING  3 - 12 % Incomplete  . Monocytes Absolute 05/02/2014 PENDING  0.1 - 1.0 K/uL Incomplete  . Eosinophils Relative 05/02/2014 PENDING  0 - 5 % Incomplete  . Eosinophils Absolute 05/02/2014 PENDING  0.0 - 0.7 K/uL Incomplete  . Basophils Relative 05/02/2014 PENDING  0 - 1 % Incomplete  . Basophils Absolute 05/02/2014 PENDING  0.0 - 0.1 K/uL Incomplete  . WBC Morphology 05/02/2014 PENDING   Incomplete  . RBC Morphology 05/02/2014 PENDING   Incomplete  . Smear Review 05/02/2014 PENDING   Incomplete  . nRBC 05/02/2014 PENDING  0 /100 WBC Incomplete  . Metamyelocytes Relative 05/02/2014 PENDING   Incomplete  . Myelocytes 05/02/2014 PENDING   Incomplete  . Promyelocytes Absolute 05/02/2014 PENDING   Incomplete  . Blasts 05/02/2014 PENDING   Incomplete    PATHOLOGY: No new pathology.  Urinalysis    Component Value Date/Time   COLORURINE YELLOW 06/26/2011 1538   APPEARANCEUR HAZY* 06/26/2011 1538   LABSPEC 1.025 06/26/2011 1538   PHURINE 5.5 06/26/2011 1538   GLUCOSEU NEGATIVE 06/26/2011 1538   HGBUR MODERATE* 06/26/2011 1538   BILIRUBINUR NEGATIVE 06/26/2011 1538   KETONESUR NEGATIVE 06/26/2011 1538   PROTEINUR 100* 06/26/2011 1538   UROBILINOGEN 0.2 06/26/2011 1538   NITRITE POSITIVE* 06/26/2011 1538   LEUKOCYTESUR LARGE* 06/26/2011 1538    RADIOGRAPHIC STUDIES: No results found.  ASSESSMENT:  #1. Iron deficiency without anemia, status post  iron infusion, normalization of hemoglobin. #2. Syncopal episodes having experienced 3 concussions in her lifetime, the last being 2 years ago. #3. Right shoulder dislocation with persistent pain. #4. Depression, on treatment with trazodone and citalopram.   PLAN:  #1. MRI of the brain with and without contrast. #2. Consult to neurology. #3. Followup in 3 months with CBC and ferritin   All questions were answered. The patient knows to call the clinic with any problems, questions or concerns. We can certainly see the patient much sooner if necessary.   I spent 25 minutes counseling the patient face to face. The total time spent in the appointment was 30 minutes.    Farrel Gobble, MD 05/02/2014 3:43 PM  DISCLAIMER:  This note was dictated with voice recognition software.  Similar sounding words can inadvertently be transcribed inaccurately and may not be corrected upon review.

## 2014-05-02 NOTE — Patient Instructions (Addendum)
[facility] Discharge Instructions  RECOMMENDATIONS MADE BY THE CONSULTANT AND ANY TEST RESULTS WILL BE SENT TO YOUR REFERRING PHYSICIAN.  MRI of brain will be scheduled for you.  We will call you with the appointment day and time. Dr. Barnet Glasgow has ordered a neurology consult for you.  Their office will call you with an appointment.  Return in 3 months for blood work and office visit.  Thank you for choosing [facility] to provide your oncology and hematology care.  To afford each patient quality time with our providers, please arrive at least 15 minutes before your scheduled appointment time.  With your help, our goal is to use those 15 minutes to complete the necessary work-up to ensure our physicians have the information they need to help with your evaluation and healthcare recommendations.    Effective January 1st, 2014, we ask that you re-schedule your appointment with our physicians should you arrive 10 or more minutes late for your appointment.  We strive to give you quality time with our providers, and arriving late affects you and other patients whose appointments are after yours.    Again, thank you for choosing [facility].  Our hope is that these requests will decrease the amount of time that you wait before being seen by our physicians.       _____________________________________________________________  Should you have questions after your visit to [facility], please contact our office at [phone] between the hours of 8:30 a.m. and 5:00 p.m.  Voicemails left after 4:30 p.m. will not be returned until the following business day.  For prescription refill requests, have your pharmacy contact our office with your prescription refill request.

## 2014-05-09 ENCOUNTER — Other Ambulatory Visit (HOSPITAL_COMMUNITY): Payer: Self-pay | Admitting: *Deleted

## 2014-05-09 ENCOUNTER — Ambulatory Visit (HOSPITAL_COMMUNITY)
Admission: RE | Admit: 2014-05-09 | Discharge: 2014-05-09 | Disposition: A | Discharge status: Home / Self Care | Payer: Medicaid Other | Source: Ambulatory Visit | Attending: Hematology and Oncology | Admitting: Hematology and Oncology

## 2014-05-09 DIAGNOSIS — S060XAA Concussion with loss of consciousness status unknown, initial encounter: Secondary | ICD-10-CM

## 2014-05-09 DIAGNOSIS — S060X9A Concussion with loss of consciousness of unspecified duration, initial encounter: Secondary | ICD-10-CM

## 2014-05-09 DIAGNOSIS — Z9181 History of falling: Secondary | ICD-10-CM | POA: Insufficient documentation

## 2014-05-09 DIAGNOSIS — R55 Syncope and collapse: Secondary | ICD-10-CM

## 2014-05-09 MED ORDER — ALPRAZOLAM 1 MG PO TABS: 0.5000 mg | ORAL_TABLET | Freq: Once | ORAL | Status: DC

## 2014-05-09 MED ORDER — GADOBENATE DIMEGLUMINE 529 MG/ML IV SOLN
14.0000 mL | Freq: Once | INTRAVENOUS | Status: AC | PRN
  Administered 2014-05-09: 14 mL via INTRAVENOUS

## 2014-05-21 ENCOUNTER — Telehealth (HOSPITAL_COMMUNITY): Payer: Self-pay

## 2014-05-21 NOTE — Telephone Encounter (Signed)
Message copied by Mellissa Kohut on Mon May 21, 2014  1:34 PM ------      Message from: [place], [place] J      Created: Mon May 21, 2014 11:13 AM      Regarding: MRI results      Contact: [phone]       Patient woul;d like to get her MRI results ------

## 2014-05-21 NOTE — Telephone Encounter (Signed)
Requests results of MRI.

## 2014-05-28 ENCOUNTER — Encounter (HOSPITAL_COMMUNITY): Payer: Self-pay

## 2014-05-28 ENCOUNTER — Telehealth (HOSPITAL_COMMUNITY): Payer: Self-pay

## 2014-05-28 NOTE — Telephone Encounter (Signed)
Message copied by Mellissa Kohut on Mon May 28, 2014 12:00 PM ------      Message from: [place], [place]: Mon May 21, 2014  1:43 PM       Her Brain MRI was normal.  Please let her know. ------

## 2014-05-28 NOTE — Telephone Encounter (Signed)
Letter sent.  Unable to reach by telephone.

## 2014-08-02 ENCOUNTER — Other Ambulatory Visit (HOSPITAL_COMMUNITY): Payer: Medicaid Other

## 2014-08-02 ENCOUNTER — Ambulatory Visit (HOSPITAL_COMMUNITY): Payer: Medicaid Other

## 2014-08-02 ENCOUNTER — Telehealth (HOSPITAL_COMMUNITY): Payer: Self-pay | Admitting: Lab

## 2014-08-03 NOTE — Progress Notes (Signed)
This encounter was created in error - please disregard.

## 2014-08-16 ENCOUNTER — Encounter (HOSPITAL_COMMUNITY): Payer: Self-pay

## 2014-10-15 ENCOUNTER — Encounter (HOSPITAL_COMMUNITY): Payer: Self-pay

## 2015-09-24 ENCOUNTER — Encounter: Payer: Self-pay | Admitting: Women's Health

## 2015-09-24 ENCOUNTER — Ambulatory Visit (INDEPENDENT_AMBULATORY_CARE_PROVIDER_SITE_OTHER): Payer: Medicaid Other | Admitting: Women's Health

## 2015-09-24 ENCOUNTER — Other Ambulatory Visit (HOSPITAL_COMMUNITY)
Admission: RE | Admit: 2015-09-24 | Discharge: 2015-09-24 | Disposition: A | Discharge status: Home / Self Care | Payer: Medicaid Other | Source: Ambulatory Visit | Attending: Obstetrics & Gynecology | Admitting: Obstetrics & Gynecology

## 2015-09-24 VITALS — BP 130/90 | Ht 62.0 in | Wt 154.5 lb

## 2015-09-24 DIAGNOSIS — Z1151 Encounter for screening for human papillomavirus (HPV): Secondary | ICD-10-CM | POA: Diagnosis present

## 2015-09-24 DIAGNOSIS — R102 Pelvic and perineal pain: Secondary | ICD-10-CM

## 2015-09-24 DIAGNOSIS — F17210 Nicotine dependence, cigarettes, uncomplicated: Secondary | ICD-10-CM | POA: Insufficient documentation

## 2015-09-24 DIAGNOSIS — Z01419 Encounter for gynecological examination (general) (routine) without abnormal findings: Secondary | ICD-10-CM

## 2015-09-24 DIAGNOSIS — Z113 Encounter for screening for infections with a predominantly sexual mode of transmission: Secondary | ICD-10-CM | POA: Insufficient documentation

## 2015-09-24 DIAGNOSIS — N926 Irregular menstruation, unspecified: Secondary | ICD-10-CM

## 2015-09-24 DIAGNOSIS — F17200 Nicotine dependence, unspecified, uncomplicated: Secondary | ICD-10-CM

## 2015-09-24 DIAGNOSIS — Z0000 Encounter for general adult medical examination without abnormal findings: Secondary | ICD-10-CM

## 2015-09-24 NOTE — Progress Notes (Signed)
Patient ID: Mandy Garcia, female   DOB: 03-Feb-1986, 29 y.o.   MRN: [id number] Subjective:   Mandy Garcia is a 29 y.o. G65P3003 Caucasian female here for a routine well-woman exam.  Patient's last menstrual period was 07/25/2015.    Current complaints: intermittent generalized pelvic pain x 2 months, dyspareunia, amenorrhea x 2 months- periods usually regular every month, had BTL 9 years ago- has some breast tenderness, worried she may be pregnant- took HPT which was 'inconclusive' 2wks ago, states her pregnancies never show up on UPT, has to have blood drawn.  PCP: [facility]       Recently had cbc, cmp, a1c w/ PCP  Social History: Sexual: heterosexual Marital Status: dating Living situation: with boyfriends and kids Occupation: unemployed Tobacco/alcohol: tobacco 1ppd- not interesting in quitting, no etoh Illicit drugs: no history of illicit drug use  The following portions of the patient's history were reviewed and updated as appropriate: allergies, current medications, past family history, past medical history, past social history, past surgical history and problem list.  Past Medical History Past Medical History  Diagnosis Date  . Anxiety   . Shoulder pain   . Anemia     Past Surgical History Past Surgical History  Procedure Laterality Date  . Tubal ligation      Gynecologic History G3P3003  Patient's last menstrual period was 07/25/2015. Contraception: tubal ligation Last Pap: 2015. Results were: ASCUS w/ -HRHPV, reports h/o 'cervical cancer' Last mammogram: never. Results were: n/a Last TCS: never  Obstetric History OB History  Gravida Para Term Preterm AB SAB TAB Ectopic Multiple Living  3 3 3       3     # Outcome Date GA Lbr Len/2nd Weight Sex Delivery Anes PTL Lv  3 Term         Y  2 Term         Y  1 Term         Y      Current Medications No current outpatient prescriptions on file prior to visit.   Current Facility-Administered Medications on  File Prior to Visit  Medication Dose Route Frequency Provider Last Rate Last Dose  . ALPRAZolam Duanne Moron) tablet 0.5 mg  0.5 mg Oral Once Farrel Gobble, MD        Review of Systems Patient denies any headaches, blurred vision, shortness of breath, chest pain, abdominal pain, problems with bowel movements, urination, or intercourse.  Objective:  BP 130/90 mmHg  Ht 5\' 2"  (1.575 m)  Wt 154 lb 8 oz (70.081 kg)  BMI 28.25 kg/m2  LMP 07/25/2015 Physical Exam  General:  Well developed, well nourished, no acute distress. She is alert and oriented x3. Skin:  Warm and dry Neck:  Midline trachea, no thyromegaly or nodules Cardiovascular: Regular rate and rhythm, no murmur heard Lungs:  Effort normal, all lung fields clear to auscultation bilaterally Breasts:  No dominant palpable mass, retraction, or nipple discharge Abdomen:  Soft, non tender, no hepatosplenomegaly or masses Pelvic:  External genitalia is normal in appearance.  The vagina is normal in appearance. The cervix is bulbous, no CMT.  Thin prep pap is done w/ HR HPV cotesting. Uterus is felt to be normal size, shape, and contour.  No adnexal masses. +tenderness to palpation of uterus and bilateral adnexae Extremities:  No swelling or varicosities noted Psych:  She has a normal mood and affect  UPT today: neg  Assessment:   Healthy well-woman exam Pelvic pain Dyspareunia Irregular menses Smoker  Plan:  TSH, BHCG today, gc/ct from pap F/U 1wk for pelvic u/s for pelvic pain/irregular menses then f/u w/ me Advised smoking cessation- not interested in quitting at this time Mammogram @ 29yo or sooner if problems Colonoscopy @29yo  or sooner if problems  Tawnya Crook CNM, [facility] 09/24/2015 11:50 AM

## 2015-09-25 ENCOUNTER — Telehealth: Payer: Self-pay | Admitting: Women's Health

## 2015-09-25 LAB — BETA HCG QUANT (REF LAB): hCG Quant: <1 m[IU]/mL

## 2015-09-25 LAB — TSH: TSH: 2.03 u[IU]/mL (ref 0.450–4.500)

## 2015-09-25 NOTE — Telephone Encounter (Signed)
LM for pt to return call, need to notify of labs.  Roma Schanz, CNM, [facility] 09/25/2015 9:16 AM

## 2015-09-26 LAB — CYTOLOGY - PAP

## 2015-09-28 ENCOUNTER — Encounter: Payer: Self-pay | Admitting: Women's Health

## 2015-09-28 DIAGNOSIS — R87810 Cervical high risk human papillomavirus (HPV) DNA test positive: Secondary | ICD-10-CM | POA: Insufficient documentation

## 2015-09-30 ENCOUNTER — Ambulatory Visit (INDEPENDENT_AMBULATORY_CARE_PROVIDER_SITE_OTHER): Payer: Medicaid Other | Admitting: Women's Health

## 2015-09-30 ENCOUNTER — Ambulatory Visit (INDEPENDENT_AMBULATORY_CARE_PROVIDER_SITE_OTHER): Payer: Medicaid Other

## 2015-09-30 ENCOUNTER — Encounter: Payer: Self-pay | Admitting: Women's Health

## 2015-09-30 VITALS — BP 130/74 | HR 64 | Wt 155.0 lb

## 2015-09-30 DIAGNOSIS — R102 Pelvic and perineal pain: Secondary | ICD-10-CM

## 2015-09-30 DIAGNOSIS — N926 Irregular menstruation, unspecified: Secondary | ICD-10-CM

## 2015-09-30 DIAGNOSIS — R87810 Cervical high risk human papillomavirus (HPV) DNA test positive: Secondary | ICD-10-CM | POA: Diagnosis not present

## 2015-09-30 NOTE — Progress Notes (Signed)
Patient ID: Mandy Garcia, female   DOB: [birth year], 29 y.o.   MRN: [id number]   [facility] Visit  Patient name: Mandy Garcia MRN [id number]  Date of birth: 1986-11-11  CC & HPI:  Mandy Garcia is a 29 y.o. G65P3003 Caucasian female presenting today to discuss pelvic u/s results, done for report of intermittent pelvic pain x 2 months, dyspareunia, amenorrhea x 2 months, s/p BTL.  Patient's last menstrual period was 07/25/2015.  Discussed labs and pap results from last week The current method of family planning is tubal ligation. Last pap 09/24/15: neg w/ +HRHPV  Pertinent History Reviewed:  Medical & Surgical Hx:   Past Medical History  Diagnosis Date  . Anxiety   . Shoulder pain   . Anemia    Past Surgical History  Procedure Laterality Date  . Tubal ligation     Medications: Reviewed & Updated - see associated section Social History: Reviewed -  reports that she has been smoking Cigarettes.  She has a 16 pack-year smoking history. She has never used smokeless tobacco.  Objective Findings:  Vitals: BP 130/74 mmHg  Pulse 64  Wt 155 lb (70.308 kg)  LMP 07/25/2015 Body mass index is 28.34 kg/(m^2).  Physical Examination: General appearance - alert, well appearing, and in no distress  No results found for this or any previous visit (from the past 24 hour(s)).   Assessment & Plan:  A:   Intermittent pelvic pain, dyspareunia, irregular periods w/ normal pelvic u/s  Neg pap w/ +HRHPV  P:  If pains continue, can seek GI referral as possible source of pain  Return in about 1 year (around 09/29/2016) for Pap & physical.  Tawnya Crook CNM, [facility] 09/30/2015 11:16 AM

## 2015-09-30 NOTE — Progress Notes (Signed)
PELVIC US TA/TV: normal anteverted uterus,normal ov's bilat (mobile),bilat adnexal pain during ultrasound,no free fluid ,EEC 35mm

## 2016-09-24 ENCOUNTER — Encounter: Payer: Self-pay | Admitting: Women's Health

## 2016-09-24 ENCOUNTER — Ambulatory Visit (INDEPENDENT_AMBULATORY_CARE_PROVIDER_SITE_OTHER): Payer: Medicaid Other | Admitting: Women's Health

## 2016-09-24 ENCOUNTER — Other Ambulatory Visit (HOSPITAL_COMMUNITY)
Admission: RE | Admit: 2016-09-24 | Discharge: 2016-09-24 | Disposition: A | Discharge status: Home / Self Care | Payer: Medicaid Other | Source: Ambulatory Visit | Attending: Obstetrics & Gynecology | Admitting: Obstetrics & Gynecology

## 2016-09-24 VITALS — BP 102/64 | HR 60 | Ht 62.75 in | Wt 168.0 lb

## 2016-09-24 DIAGNOSIS — Z1151 Encounter for screening for human papillomavirus (HPV): Secondary | ICD-10-CM | POA: Insufficient documentation

## 2016-09-24 DIAGNOSIS — Z01419 Encounter for gynecological examination (general) (routine) without abnormal findings: Secondary | ICD-10-CM | POA: Insufficient documentation

## 2016-09-24 DIAGNOSIS — Z113 Encounter for screening for infections with a predominantly sexual mode of transmission: Secondary | ICD-10-CM | POA: Insufficient documentation

## 2016-09-24 DIAGNOSIS — R300 Dysuria: Secondary | ICD-10-CM | POA: Diagnosis not present

## 2016-09-24 DIAGNOSIS — Z0000 Encounter for general adult medical examination without abnormal findings: Secondary | ICD-10-CM | POA: Diagnosis not present

## 2016-09-24 DIAGNOSIS — N6310 Unspecified lump in the right breast, unspecified quadrant: Secondary | ICD-10-CM

## 2016-09-24 DIAGNOSIS — F17200 Nicotine dependence, unspecified, uncomplicated: Secondary | ICD-10-CM

## 2016-09-24 LAB — POCT URINALYSIS DIPSTICK
Blood, UA: NEGATIVE
Glucose, UA: NEGATIVE
KETONES UA: NEGATIVE
LEUKOCYTES UA: NEGATIVE
NITRITE UA: NEGATIVE
PROTEIN UA: NEGATIVE

## 2016-09-24 NOTE — Progress Notes (Signed)
Subjective:   Mandy Garcia is a 30 y.o. G36P3003 Caucasian female here for a routine well-woman exam.  Patient's last menstrual period was 09/03/2016.    Current complaints: breast mass Rt side x 48mth, irregular periods, burning w/ urination x 1wk PCP: none       Does desire labs including STI screening Reports h/o cancer when she was pregnant in 2006/2007- poor historian, states they took 'part of it off', thinks it was cervical- possible abnormal pap w/ cervical bx? Had normal pelvic u/s 09/30/15  Social History: Sexual: heterosexual Marital Status: dating same man x 74yrs Living situation: w/ boyfriend Occupation: unemployed Tobacco/alcohol: smokes 1ppd not interested in quitting, no etoh Illicit drugs: no history of illicit drug use  The following portions of the patient's history were reviewed and updated as appropriate: allergies, current medications, past family history, past medical history, past social history, past surgical history and problem list.  Past Medical History Past Medical History:  Diagnosis Date  . Anemia   . Anxiety   . Cancer ([place])   . HPV in female   . Shoulder pain     Past Surgical History Past Surgical History:  Procedure Laterality Date  . TOE SURGERY    . TUBAL LIGATION      Gynecologic History G3P3003  Patient's last menstrual period was 09/03/2016. Contraception: tubal ligation Last Pap: 2016. Results were: normal w/ +HRHPV Last mammogram: never. Results were: n/a Last TCS: never  Obstetric History OB History  Gravida Para Term Preterm AB Living  3 3 3     3   SAB TAB Ectopic Multiple Live Births          3    # Outcome Date GA Lbr Len/2nd Weight Sex Delivery Anes PTL Lv  3 Term         LIV  2 Term         LIV  1 Term         LIV      Current Medications Current Outpatient Prescriptions on File Prior to Visit  Medication Sig Dispense Refill  . ferrous sulfate 325 (65 FE) MG tablet Take 325 mg by mouth daily with  breakfast.    . FLUoxetine (PROZAC) 10 MG tablet Take 10 mg by mouth daily.    . prazosin (MINIPRESS) 2 MG capsule Take 2 mg by mouth at bedtime.     Current Facility-Administered Medications on File Prior to Visit  Medication Dose Route Frequency Provider Last Rate Last Dose  . ALPRAZolam Duanne Moron) tablet 0.5 mg  0.5 mg Oral Once Farrel Gobble, MD        Review of Systems Patient denies any headaches, blurred vision, shortness of breath, chest pain, abdominal pain, problems with bowel movements, urination, or intercourse.  Objective:  BP 102/64 (BP Location: Right Arm, Patient Position: Sitting, Cuff Size: Normal)   Pulse 60   Ht 5' 2.75" (1.594 m)   Wt 168 lb (76.2 kg)   LMP 09/03/2016   BMI 30.00 kg/m  Physical Exam  General:  Well developed, well nourished, no acute distress. She is alert and oriented x3. Skin:  Warm and dry Neck:  Midline trachea, no thyromegaly or nodules Cardiovascular: Regular rate and rhythm, no murmur heard Lungs:  Effort normal, all lung fields clear to auscultation bilaterally Breasts:  Lt: No dominant palpable mass, retraction, or nipple discharge Rt: small slightly tender mass 10 o'clock 40fb from nipple Abdomen:  Soft, non tender, no hepatosplenomegaly or masses Pelvic:  External  genitalia is normal in appearance.  The vagina is normal in appearance. The cervix is bulbous, no CMT.  Thin prep pap is done w/ HR HPV cotesting. Uterus is felt to be normal size, shape, and contour.  No adnexal masses noted. +suprapubic and bilaternal adnexal tenderness.  Extremities:  No swelling or varicosities noted Psych:  She has a normal mood and affect   Assessment:   Healthy well-woman exam Rt breast mass Irregular periods Smoker Dysuria  Plan:  Send urine for culture CBC, CMP, TSH, HIV, RPR, Hep B today, GC/CT from pap Advised smoking cessation, not interested F/U 87yr for physical, or sooner if needed Colonoscopy @30yo  or sooner if problems Get  records from [facility] from ?cancer/abnormal pap 2006/2007 Breast mammo and u/s 10/17 @ 3:20pm, be there @ 3:10, no lotion/powder/deoderant/perfume   Tawnya Crook CNM, [facility] 09/24/2016 9:36 AM

## 2016-09-24 NOTE — Patient Instructions (Addendum)
[facility] [phone] Dr. Moshe Cipro [phone]  Breast ultrasound/mammogram: 10/17 @ 3:20pm, be there at 3:10pm. No lotion/deoderant/powder/perfume that day  Constipation  Drink plenty of fluid, preferably water, throughout the day  Eat foods high in fiber such as fruits, vegetables, and grains  Exercise, such as walking, is a good way to keep your bowels regular  Drink warm fluids, especially warm prune juice, or decaf coffee  Eat a 1/2 cup of real oatmeal (not instant), 1/2 cup applesauce, and 1/2-1 cup warm prune juice every day  If needed, you may take Colace (docusate sodium) stool softener once or twice a day to help keep the stool soft. If you are pregnant, wait until you are out of your first trimester (12-14 weeks of pregnancy)  If you still are having problems with constipation, you may take Miralax once daily as needed to help keep your bowels regular.  If you are pregnant, wait until you are out of your first trimester (12-14 weeks of pregnancy)

## 2016-09-24 NOTE — Addendum Note (Signed)
Addended by: Traci Sermon A on: 09/24/2016 10:35 AM   Modules accepted: Orders

## 2016-09-25 LAB — CBC
HEMATOCRIT: 47.7 % — AB (ref 34.0–46.6)
HEMOGLOBIN: 16 g/dL — AB (ref 11.1–15.9)
MCH: 29.9 pg (ref 26.6–33.0)
MCHC: 33.5 g/dL (ref 31.5–35.7)
MCV: 89 fL (ref 79–97)
Platelets: 228 10*3/uL (ref 150–379)
RBC: 5.36 x10E6/uL — ABNORMAL HIGH (ref 3.77–5.28)
RDW: 14.2 % (ref 12.3–15.4)
WBC: 12 10*3/uL — ABNORMAL HIGH (ref 3.4–10.8)

## 2016-09-25 LAB — COMPREHENSIVE METABOLIC PANEL
A/G RATIO: 1.8 (ref 1.2–2.2)
ALBUMIN: 4.4 g/dL (ref 3.5–5.5)
ALK PHOS: 72 IU/L (ref 39–117)
ALT: 9 IU/L (ref 0–32)
AST: 16 IU/L (ref 0–40)
BILIRUBIN TOTAL: 0.3 mg/dL (ref 0.0–1.2)
BUN / CREAT RATIO: 14 (ref 9–23)
BUN: 12 mg/dL (ref 6–20)
CHLORIDE: 99 mmol/L (ref 96–106)
CO2: 25 mmol/L (ref 18–29)
Calcium: 9.7 mg/dL (ref 8.7–10.2)
Creatinine, Ser: 0.84 mg/dL (ref 0.57–1.00)
GFR calc Af Amer: 106 mL/min/{1.73_m2} (ref >59)
GFR calc non Af Amer: 92 mL/min/{1.73_m2} (ref >59)
GLOBULIN, TOTAL: 2.5 g/dL (ref 1.5–4.5)
Glucose: 80 mg/dL (ref 65–99)
POTASSIUM: 5 mmol/L (ref 3.5–5.2)
SODIUM: 137 mmol/L (ref 134–144)
Total Protein: 6.9 g/dL (ref 6.0–8.5)

## 2016-09-25 LAB — TSH: TSH: 2.29 u[IU]/mL (ref 0.450–4.500)

## 2016-09-25 LAB — CYTOLOGY - PAP

## 2016-09-25 LAB — HEPATITIS B SURFACE ANTIGEN: HEP B S AG: NEGATIVE

## 2016-09-25 LAB — HIV ANTIBODY (ROUTINE TESTING W REFLEX): HIV Screen 4th Generation wRfx: NONREACTIVE

## 2016-09-25 LAB — RPR: RPR Ser Ql: NONREACTIVE

## 2016-09-26 LAB — URINE CULTURE: Organism ID, Bacteria: NO GROWTH

## 2016-09-29 ENCOUNTER — Encounter: Payer: Self-pay | Admitting: Women's Health

## 2016-09-29 ENCOUNTER — Ambulatory Visit (HOSPITAL_COMMUNITY)
Admission: RE | Admit: 2016-09-29 | Discharge: 2016-09-29 | Disposition: A | Discharge status: Home / Self Care | Payer: Medicaid Other | Source: Ambulatory Visit | Attending: Women's Health | Admitting: Women's Health

## 2016-09-29 DIAGNOSIS — N6310 Unspecified lump in the right breast, unspecified quadrant: Secondary | ICD-10-CM

## 2016-09-29 DIAGNOSIS — N6311 Unspecified lump in the right breast, upper outer quadrant: Secondary | ICD-10-CM | POA: Diagnosis not present

## 2016-09-29 DIAGNOSIS — Z803 Family history of malignant neoplasm of breast: Secondary | ICD-10-CM | POA: Diagnosis not present

## 2017-09-27 ENCOUNTER — Ambulatory Visit (INDEPENDENT_AMBULATORY_CARE_PROVIDER_SITE_OTHER): Payer: Medicaid Other | Admitting: Women's Health

## 2017-09-27 ENCOUNTER — Encounter (INDEPENDENT_AMBULATORY_CARE_PROVIDER_SITE_OTHER): Payer: Self-pay

## 2017-09-27 ENCOUNTER — Encounter: Payer: Self-pay | Admitting: Women's Health

## 2017-09-27 VITALS — BP 122/76 | HR 98 | Ht 64.0 in | Wt 168.0 lb

## 2017-09-27 DIAGNOSIS — Z01411 Encounter for gynecological examination (general) (routine) with abnormal findings: Secondary | ICD-10-CM | POA: Diagnosis not present

## 2017-09-27 DIAGNOSIS — Z01419 Encounter for gynecological examination (general) (routine) without abnormal findings: Secondary | ICD-10-CM

## 2017-09-27 DIAGNOSIS — K5900 Constipation, unspecified: Secondary | ICD-10-CM | POA: Diagnosis not present

## 2017-09-27 DIAGNOSIS — N946 Dysmenorrhea, unspecified: Secondary | ICD-10-CM

## 2017-09-27 DIAGNOSIS — F17200 Nicotine dependence, unspecified, uncomplicated: Secondary | ICD-10-CM

## 2017-09-27 DIAGNOSIS — N644 Mastodynia: Secondary | ICD-10-CM | POA: Diagnosis not present

## 2017-09-27 DIAGNOSIS — Z113 Encounter for screening for infections with a predominantly sexual mode of transmission: Secondary | ICD-10-CM | POA: Diagnosis not present

## 2017-09-27 DIAGNOSIS — N920 Excessive and frequent menstruation with regular cycle: Secondary | ICD-10-CM | POA: Insufficient documentation

## 2017-09-27 DIAGNOSIS — K649 Unspecified hemorrhoids: Secondary | ICD-10-CM | POA: Diagnosis not present

## 2017-09-27 MED ORDER — MEDROXYPROGESTERONE ACETATE 150 MG/ML IM SUSP: 150.0000 mg | INTRAMUSCULAR | 3 refills | Status: DC

## 2017-09-27 NOTE — Patient Instructions (Addendum)
Call us when your period starts to come in and get your depo injection (bring the depo with you)  Call us and let us know what type of breast cancer your mom had and how old she was when she was diagnosed with it  Preparation H or any over the counter hemorrhoid cream to help with hemorrhoids  Primary Care Providers  Dr. Wende Neighbors [place]) [phone]  [facility] Primary Care [phone]  Primary Wellness Care [facility]) Dr. Anastasio Champion [phone]  [facility]) [phone]  [facility]) [place] [phone]    Constipation  Drink plenty of fluid, preferably water, throughout the day  Eat foods high in fiber such as fruits, vegetables, and grains  Exercise, such as walking, is a good way to keep your bowels regular  Drink warm fluids, especially warm prune juice, or decaf coffee  Eat a 1/2 cup of real oatmeal (not instant), 1/2 cup applesauce, and 1/2-1 cup warm prune juice every day  If needed, you may take Colace (docusate sodium) stool softener once or twice a day to help keep the stool soft. If you are pregnant, wait until you are out of your first trimester (12-14 weeks of pregnancy)  If you still are having problems with constipation, you may take Miralax once daily as needed to help keep your bowels regular.  If you are pregnant, wait until you are out of your first trimester (12-14 weeks of pregnancy)  Gastrointestinal (GI) Doctors in [place] (call if constipation doesn't improve)  [facility] (Dr. Gala Romney & Dr. Oneida Alar) [phone]  Dr. Laural Golden [phone]  Medroxyprogesterone injection [Contraceptive] What is this medicine? MEDROXYPROGESTERONE (me DROX ee proe JES te rone) contraceptive injections prevent pregnancy. They provide effective birth control for 3 months. Depo-subQ Provera 104 is also used for treating pain related to endometriosis. This medicine may be used for other  purposes; ask your health care provider or pharmacist if you have questions. COMMON BRAND NAME(S): Depo-Provera, Depo-subQ Provera 104 What should I tell my health care provider before I take this medicine? They need to know if you have any of these conditions: -frequently drink alcohol -asthma -blood vessel disease or a history of a blood clot in the lungs or legs -bone disease such as osteoporosis -breast cancer -diabetes -eating disorder (anorexia nervosa or bulimia) -high blood pressure -HIV infection or AIDS -kidney disease -liver disease -mental depression -migraine -seizures (convulsions) -stroke -tobacco smoker -vaginal bleeding -an unusual or allergic reaction to medroxyprogesterone, other hormones, medicines, foods, dyes, or preservatives -pregnant or trying to get pregnant -breast-feeding How should I use this medicine? Depo-Provera Contraceptive injection is given into a muscle. Depo-subQ Provera 104 injection is given under the skin. These injections are given by a health care professional. You must not be pregnant before getting an injection. The injection is usually given during the first 5 days after the start of a menstrual period or 6 weeks after delivery of a baby. Talk to your pediatrician regarding the use of this medicine in children. Special care may be needed. These injections have been used in female children who have started having menstrual periods. Overdosage: If you think you have taken too much of this medicine contact a poison control center or emergency room at once. NOTE: This medicine is only for you. Do not share this medicine with others. What if I miss a dose? Try not to miss a dose. You must get an injection once every 3 months to maintain birth control. If you  cannot keep an appointment, call and reschedule it. If you wait longer than 13 weeks between Depo-Provera contraceptive injections or longer than 14 weeks between Depo-subQ Provera 104  injections, you could get pregnant. Use another method for birth control if you miss your appointment. You may also need a pregnancy test before receiving another injection. What may interact with this medicine? Do not take this medicine with any of the following medications: -bosentan This medicine may also interact with the following medications: -aminoglutethimide -antibiotics or medicines for infections, especially rifampin, rifabutin, rifapentine, and griseofulvin -aprepitant -barbiturate medicines such as phenobarbital or primidone -bexarotene -carbamazepine -medicines for seizures like ethotoin, felbamate, oxcarbazepine, phenytoin, topiramate -modafinil -St. John's wort This list may not describe all possible interactions. Give your health care provider a list of all the medicines, herbs, non-prescription drugs, or dietary supplements you use. Also tell them if you smoke, drink alcohol, or use illegal drugs. Some items may interact with your medicine. What should I watch for while using this medicine? This drug does not protect you against HIV infection (AIDS) or other sexually transmitted diseases. Use of this product may cause you to lose calcium from your bones. Loss of calcium may cause weak bones (osteoporosis). Only use this product for more than 2 years if other forms of birth control are not right for you. The longer you use this product for birth control the more likely you will be at risk for weak bones. Ask your health care professional how you can keep strong bones. You may have a change in bleeding pattern or irregular periods. Many females stop having periods while taking this drug. If you have received your injections on time, your chance of being pregnant is very low. If you think you may be pregnant, see your health care professional as soon as possible. Tell your health care professional if you want to get pregnant within the next year. The effect of this medicine may  last a long time after you get your last injection. What side effects may I notice from receiving this medicine? Side effects that you should report to your doctor or health care professional as soon as possible: -allergic reactions like skin rash, itching or hives, swelling of the face, lips, or tongue -breast tenderness or discharge -breathing problems -changes in vision -depression -feeling faint or lightheaded, falls -fever -pain in the abdomen, chest, groin, or leg -problems with balance, talking, walking -unusually weak or tired -yellowing of the eyes or skin Side effects that usually do not require medical attention (report to your doctor or health care professional if they continue or are bothersome): -acne -fluid retention and swelling -headache -irregular periods, spotting, or absent periods -temporary pain, itching, or skin reaction at site where injected -weight gain This list may not describe all possible side effects. Call your doctor for medical advice about side effects. You may report side effects to FDA at 1-800-FDA-1088. Where should I keep my medicine? This does not apply. The injection will be given to you by a health care professional. NOTE: This sheet is a summary. It may not cover all possible information. If you have questions about this medicine, talk to your doctor, pharmacist, or health care provider.  2018 Elsevier/Gold Standard (2008-12-21 18:37:56)  Steps to Quit Smoking Smoking tobacco can be harmful to your health and can affect almost every organ in your body. Smoking puts you, and those around you, at risk for developing many serious chronic diseases. Quitting smoking is difficult, but it is  one of the best things that you can do for your health. It is never too late to quit. What are the benefits of quitting smoking? When you quit smoking, you lower your risk of developing serious diseases and conditions, such as:  Lung cancer or lung disease, such  as COPD.  Heart disease.  Stroke.  Heart attack.  Infertility.  Osteoporosis and bone fractures.  Additionally, symptoms such as coughing, wheezing, and shortness of breath may get better when you quit. You may also find that you get sick less often because your body is stronger at fighting off colds and infections. If you are pregnant, quitting smoking can help to reduce your chances of having a baby of low birth weight. How do I get ready to quit? When you decide to quit smoking, create a plan to make sure that you are successful. Before you quit:  Pick a date to quit. Set a date within the next two weeks to give you time to prepare.  Write down the reasons why you are quitting. Keep this list in places where you will see it often, such as on your bathroom mirror or in your car or wallet.  Identify the people, places, things, and activities that make you want to smoke (triggers) and avoid them. Make sure to take these actions: ? Throw away all cigarettes at home, at work, and in your car. ? Throw away smoking accessories, such as Scientist, research (medical). ? Clean your car and make sure to empty the ashtray. ? Clean your home, including curtains and carpets.  Tell your family, friends, and coworkers that you are quitting. Support from your loved ones can make quitting easier.  Talk with your health care provider about your options for quitting smoking.  Find out what treatment options are covered by your health insurance.  What strategies can I use to quit smoking? Talk with your healthcare provider about different strategies to quit smoking. Some strategies include:  Quitting smoking altogether instead of gradually lessening how much you smoke over a period of time. Research shows that quitting "cold [place]" is more successful than gradually quitting.  Attending in-person counseling to help you build problem-solving skills. You are more likely to have success in quitting if you  attend several counseling sessions. Even short sessions of 10 minutes can be effective.  Finding resources and support systems that can help you to quit smoking and remain smoke-free after you quit. These resources are most helpful when you use them often. They can include: ? Online chats with a Social worker. ? Telephone quitlines. ? Careers information officer. ? Support groups or group counseling. ? Text messaging programs. ? Mobile phone applications.  Taking medicines to help you quit smoking. (If you are pregnant or breastfeeding, talk with your health care provider first.) Some medicines contain nicotine and some do not. Both types of medicines help with cravings, but the medicines that include nicotine help to relieve withdrawal symptoms. Your health care provider may recommend: ? Nicotine patches, gum, or lozenges. ? Nicotine inhalers or sprays. ? Non-nicotine medicine that is taken by mouth.  Talk with your health care provider about combining strategies, such as taking medicines while you are also receiving in-person counseling. Using these two strategies together makes you more likely to succeed in quitting than if you used either strategy on its own. If you are pregnant or breastfeeding, talk with your health care provider about finding counseling or other support strategies to quit smoking. Do not take  medicine to help you quit smoking unless told to do so by your health care provider. What things can I do to make it easier to quit? Quitting smoking might feel overwhelming at first, but there is a lot that you can do to make it easier. Take these important actions:  Reach out to your family and friends and ask that they support and encourage you during this time. Call telephone quitlines, reach out to support groups, or work with a counselor for support.  Ask people who smoke to avoid smoking around you.  Avoid places that trigger you to smoke, such as bars, parties, or smoke-break  areas at work.  Spend time around people who do not smoke.  Lessen stress in your life, because stress can be a smoking trigger for some people. To lessen stress, try: ? Exercising regularly. ? Deep-breathing exercises. ? Yoga. ? Meditating. ? Performing a body scan. This involves closing your eyes, scanning your body from head to toe, and noticing which parts of your body are particularly tense. Purposefully relax the muscles in those areas.  Download or purchase mobile phone or tablet apps (applications) that can help you stick to your quit plan by providing reminders, tips, and encouragement. There are many free apps, such as QuitGuide from the [employer] Office manager for Disease Control and Prevention). You can find other support for quitting smoking (smoking cessation) through smokefree.gov and other websites.  How will I feel when I quit smoking? Within the first 24 hours of quitting smoking, you may start to feel some withdrawal symptoms. These symptoms are usually most noticeable 2-3 days after quitting, but they usually do not last beyond 2-3 weeks. Changes or symptoms that you might experience include:  Mood swings.  Restlessness, anxiety, or irritation.  Difficulty concentrating.  Dizziness.  Strong cravings for sugary foods in addition to nicotine.  Mild weight gain.  Constipation.  Nausea.  Coughing or a sore throat.  Changes in how your medicines work in your body.  A depressed mood.  Difficulty sleeping (insomnia).  After the first 2-3 weeks of quitting, you may start to notice more positive results, such as:  Improved sense of smell and taste.  Decreased coughing and sore throat.  Slower heart rate.  Lower blood pressure.  Clearer skin.  The ability to breathe more easily.  Fewer sick days.  Quitting smoking is very challenging for most people. Do not get discouraged if you are not successful the first time. Some people need to make many attempts to quit  before they achieve long-term success. Do your best to stick to your quit plan, and talk with your health care provider if you have any questions or concerns. This information is not intended to replace advice given to you by your health care provider. Make sure you discuss any questions you have with your health care provider. Document Released: 11/24/2001 Document Revised: 07/28/2016 Document Reviewed: 04/16/2015 Elsevier Interactive Patient Education  2017 [employer].

## 2017-09-27 NOTE — Progress Notes (Signed)
[facility]  Patient name: Mandy Garcia MRN [id number]  Date of birth: 1986-08-15 CC & HPI:  Mandy Garcia is a 31 y.o. G14P3003 Caucasian female being seen today for a routine well-woman exam.  Current complaints:  1) constipation- sometimes will only go once/mth, stool is hard to pass/has to bear down when she does go, has increased fiber in her diet but no other measures to help. Her anal area has also been hurting lately, constant, not just with bm's. Denies bleeding.  2) Painful periods, has had BTL, periods are q 1-92mths x 5d, changes pad ~2-4x/day, midol eases pain some, hasn't tried anything else. Does not want to go on coc's, is interested in depo, stopped her periods in the past.  3) Rt breast pain x ~58mths, constant, worse when takes bra off. Hasn't noticed any lumps/bumps or other breast changes. Drinks 1L of MelloYello daily, 1 cup coffee, and occ sweet tea. Smokes.   PCP: none      does desire labs: only wants CBC and gc/ct Patient's last menstrual period was 09/22/2017. The current method of family planning is tubal ligation Last pap 09/24/16. Results were: neg w/ -HRHPV Last mammogram: 09/29/16 d/t Rt breast lump. Results were: normal, Bi-rads 1. Pt reported to them her mother was dx w/ breast ca @ 10yo, however this was not listed in her family hx. Pt reports today that mom was just dx a few years ago- and she is now in her 65s. Pt is historically a poor historian. Pt to go home and ask mom what type of breast cancer and age at dx and call back/let us know Last colonoscopy: never. Results were: n/a  Review of Systems:   Denies any headaches, blurred vision, fatigue, shortness of breath, chest pain, abdominal pain, abnormal vaginal discharge/itching/odor/irritation, urination, or intercourse unless otherwise stated above.  Pertinent History Reviewed:  Reviewed past medical,surgical, social and family history.  Reviewed problem list, medications  and allergies. Objective Findings:   Vitals:   09/27/17 0839  BP: 122/76  Pulse: 98  Weight: 168 lb (76.2 kg)  Height: 5\' 4"  (1.626 m)  Body mass index is 28.84 kg/m.  Physical Examination: General appearance - well appearing, and in no distress Mental status - alert, oriented to person, place, and time Psych:  She has a normal mood and affect Skin - warm and dry, normal color, no suspicious lesions noted Chest - effort normal, all lung fields clear to auscultation bilaterally Heart - normal rate and regular rhythm Neck:  midline trachea, no thyromegaly or nodules Breasts - breasts appear normal, no suspicious masses, no skin or nipple changes or  axillary nodes, no pain to palpation of either breast Abdomen - soft, nontender, nondistended, no masses or organomegaly Pelvic - VULVA: normal appearing vulva with no masses, tenderness or lesions  VAGINA: normal appearing vagina with normal color and discharge, no lesions  CERVIX: normal appearing cervix without discharge or lesions, no CMT  Thin prep pap is not done HR HPV cotesting  UTERUS: uterus is felt to be normal size, shape, consistency and nontender   ADNEXA: No adnexal masses or tenderness noted. Rectal - +non-thrombosed external hemorrhoid Extremities:  No swelling or varicosities noted  Assessment & Plan:  1) Healthy Well-Woman Exam 2) Constipation>gave printed prevention/relief measures- if not improving, make appt w/ GI MD (numbers given) 3) External non-thrombosed hemorrhoid> can try otc hemorrhoid creams, work on improving constipation 4) Dysmenorrhea> pt wants to try depo, rx given  x 47yr, to call back when period starts for 1st injection, can also try ibuprofen/nsaids, heating pad, etc 5) Rt breast pain> no palpable area of concern, she is drinking a lot of caffeine, try slowly cutting back on caffeine intake to see if improves, if not let us know 6) Smoker> advised cessation, discussed risks to her, she is not  interested in quitting right now, gave printed info on cessation in case she changes her mind 7) ? H/o breast cancer in mom> pt to ask mom how old she was when she was dx and what type of breast cancer she had, if in fact mom was dx, depending on age at dx-pt will need yearly mammos and breast MRIs 8) STD screen> gc/ct today  CBC, gc/ct today Gave pt printed list of local PCPs Mammogram>depending on mom's hx, or sooner if problems Colonoscopy @31yo  or sooner if problems  Orders Placed This Encounter  Procedures  . GC/Chlamydia Probe Amp  . CBC    Return for will call when period starts for depo, then 57yr for physical.  Tawnya Crook CNM, [facility] 09/27/2017 9:26 AM

## 2017-09-28 LAB — CBC
HEMOGLOBIN: 15.5 g/dL (ref 11.1–15.9)
Hematocrit: 46.5 % (ref 34.0–46.6)
MCH: 28.9 pg (ref 26.6–33.0)
MCHC: 33.3 g/dL (ref 31.5–35.7)
MCV: 87 fL (ref 79–97)
PLATELETS: 241 10*3/uL (ref 150–379)
RBC: 5.36 x10E6/uL — AB (ref 3.77–5.28)
RDW: 14.9 % (ref 12.3–15.4)
WBC: 9.2 10*3/uL (ref 3.4–10.8)

## 2017-09-29 LAB — GC/CHLAMYDIA PROBE AMP
CHLAMYDIA, DNA PROBE: NEGATIVE
NEISSERIA GONORRHOEAE BY PCR: NEGATIVE

## 2017-10-19 ENCOUNTER — Ambulatory Visit (INDEPENDENT_AMBULATORY_CARE_PROVIDER_SITE_OTHER): Payer: Medicaid Other | Admitting: *Deleted

## 2017-10-19 ENCOUNTER — Encounter: Payer: Self-pay | Admitting: *Deleted

## 2017-10-19 DIAGNOSIS — Z3042 Encounter for surveillance of injectable contraceptive: Secondary | ICD-10-CM

## 2017-10-19 DIAGNOSIS — Z3202 Encounter for pregnancy test, result negative: Secondary | ICD-10-CM | POA: Diagnosis not present

## 2017-10-19 LAB — POCT URINE PREGNANCY: Preg Test, Ur: NEGATIVE

## 2017-10-19 MED ORDER — MEDROXYPROGESTERONE ACETATE 150 MG/ML IM SUSP
150.0000 mg | Freq: Once | INTRAMUSCULAR | Status: AC
  Administered 2017-10-19: 150 mg via INTRAMUSCULAR

## 2017-10-19 NOTE — Progress Notes (Signed)
Pt given DepoProvera 150mg  IM right deltoid without complications. Advised pt to return in 12 weeks for next injection.

## 2018-01-10 ENCOUNTER — Encounter (INDEPENDENT_AMBULATORY_CARE_PROVIDER_SITE_OTHER): Payer: Self-pay

## 2018-01-10 ENCOUNTER — Encounter: Payer: Self-pay | Admitting: *Deleted

## 2018-01-10 ENCOUNTER — Ambulatory Visit (INDEPENDENT_AMBULATORY_CARE_PROVIDER_SITE_OTHER): Payer: Medicaid Other | Admitting: *Deleted

## 2018-01-10 ENCOUNTER — Other Ambulatory Visit: Payer: Self-pay

## 2018-01-10 DIAGNOSIS — Z3202 Encounter for pregnancy test, result negative: Secondary | ICD-10-CM | POA: Diagnosis not present

## 2018-01-10 DIAGNOSIS — Z3042 Encounter for surveillance of injectable contraceptive: Secondary | ICD-10-CM | POA: Diagnosis not present

## 2018-01-10 LAB — POCT URINE PREGNANCY: Preg Test, Ur: NEGATIVE

## 2018-01-10 MED ORDER — MEDROXYPROGESTERONE ACETATE 150 MG/ML IM SUSP
150.0000 mg | Freq: Once | INTRAMUSCULAR | Status: AC
  Administered 2018-01-10: 150 mg via INTRAMUSCULAR

## 2018-01-10 NOTE — Progress Notes (Signed)
Pt given Depo Provera 150mg  IM left deltoid without complications. Advised pt to return in 12 weeks for next injection.

## 2018-01-11 ENCOUNTER — Ambulatory Visit: Payer: Medicaid Other

## 2018-03-16 ENCOUNTER — Ambulatory Visit: Payer: Medicaid Other

## 2018-03-31 ENCOUNTER — Encounter: Payer: Self-pay | Admitting: Adult Health

## 2018-03-31 ENCOUNTER — Ambulatory Visit (INDEPENDENT_AMBULATORY_CARE_PROVIDER_SITE_OTHER): Payer: Medicaid Other | Admitting: Adult Health

## 2018-03-31 VITALS — BP 120/80 | HR 94 | Ht 62.0 in | Wt 166.3 lb

## 2018-03-31 DIAGNOSIS — Z3042 Encounter for surveillance of injectable contraceptive: Secondary | ICD-10-CM | POA: Diagnosis not present

## 2018-03-31 DIAGNOSIS — N921 Excessive and frequent menstruation with irregular cycle: Secondary | ICD-10-CM | POA: Insufficient documentation

## 2018-03-31 DIAGNOSIS — Z3202 Encounter for pregnancy test, result negative: Secondary | ICD-10-CM

## 2018-03-31 DIAGNOSIS — Z8742 Personal history of other diseases of the female genital tract: Secondary | ICD-10-CM | POA: Insufficient documentation

## 2018-03-31 DIAGNOSIS — Z7689 Persons encountering health services in other specified circumstances: Secondary | ICD-10-CM

## 2018-03-31 DIAGNOSIS — N946 Dysmenorrhea, unspecified: Secondary | ICD-10-CM

## 2018-03-31 LAB — POCT URINE PREGNANCY: Preg Test, Ur: NEGATIVE

## 2018-03-31 MED ORDER — MEDROXYPROGESTERONE ACETATE 150 MG/ML IM SUSP
150.0000 mg | Freq: Once | INTRAMUSCULAR | Status: AC
  Administered 2018-03-31: 150 mg via INTRAMUSCULAR

## 2018-03-31 MED ORDER — MEGESTROL ACETATE 40 MG PO TABS: ORAL_TABLET | ORAL | 1 refills | Status: DC

## 2018-03-31 NOTE — Addendum Note (Signed)
Addended by: Diona Fanti A on: 03/31/2018 11:46 AM   Modules accepted: Orders

## 2018-03-31 NOTE — Progress Notes (Signed)
Subjective:     Patient ID: Mandy Garcia, female   DOB: 06/16/1986, 32 y.o.   MRN: [id number]  HPI work in pt. Mandy Garcia is a 32 year old white female in to get depo today, and complained of BTB for last 21 days.It is light and not painful, but she is on the depo to control her bleeding, has had tubes tied. Had normal pap 2018  Review of Systems +heavy painful periods if not on depo +BTB with depo,light,not painful Reviewed past medical,surgical, social and family history. Reviewed medications and allergies.     Objective:   Physical Exam BP 120/80 (BP Location: Right Arm, Patient Position: Sitting, Cuff Size: Small)   Pulse 94   Ht 5\' 2"  (1.575 m)   Wt 166 lb 4.8 oz (75.4 kg)   LMP 03/09/2018   BMI 30.42 kg/m PHQ 9 score 0,she is on meds and sees [facility].  UPT negative. Skin warm and dry.Pelvic: external genitalia is normal in appearance no lesions, vagina: +period blood,urethra has no lesions or masses noted, cervix:smooth and bulbous, uterus: normal size, shape and contour, mildly tender, no masses felt, adnexa: no masses or tenderness noted. Bladder is non tender and no masses felt.  She is interested in ablation now.   Will rx megace, she declines STD testing.She received depo in office today by P.Dones,CMA.  Assessment:     1. Irregular intermenstrual bleeding   2. History of menorrhagia   3. Dysmenorrhea   4. Encounter for menstrual regulation       Plan:    Review handout on ablation Meds ordered this encounter  Medications  . megestrol (MEGACE) 40 MG tablet    Sig: Take 3 for 5 days then 2 x 5 days then 1 daily    Dispense:  45 tablet    Refill:  1    Order Specific Question:   Supervising Provider    Answer:   Florian Buff [[id number]]  Return in 12 weeks if wants depo, but if wants ablation, call and make appt with MD

## 2018-04-04 ENCOUNTER — Ambulatory Visit: Payer: Medicaid Other

## 2018-05-13 ENCOUNTER — Ambulatory Visit (INDEPENDENT_AMBULATORY_CARE_PROVIDER_SITE_OTHER): Payer: Medicaid Other | Admitting: Obstetrics and Gynecology

## 2018-05-13 ENCOUNTER — Encounter: Payer: Self-pay | Admitting: Obstetrics and Gynecology

## 2018-05-13 VITALS — BP 138/80 | HR 81 | Ht 62.0 in | Wt 167.0 lb

## 2018-05-13 DIAGNOSIS — N921 Excessive and frequent menstruation with irregular cycle: Secondary | ICD-10-CM

## 2018-05-13 DIAGNOSIS — Z01818 Encounter for other preprocedural examination: Secondary | ICD-10-CM

## 2018-05-13 DIAGNOSIS — N946 Dysmenorrhea, unspecified: Secondary | ICD-10-CM

## 2018-05-13 NOTE — Progress Notes (Signed)
Preoperative History and Physical  Mandy Garcia is a 32 y.o. 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 Last seen in office 03/31/2018, was told to follow up for pre-op endometrial ablation. Her last pap was 2017 and normal. Pt reports that the depo isn't working. Pt reports that she had bleeding and last month she received megace that stopped the bleeding. Pt last received megace and depo this past month. Pt reports that she believes that the megace is giving her heartburn but is unsure of this. Pt has had tubal ligation almost 12 years ago. She has 3 children, ages 60, 4, and 31. No significant preoperative concerns.  Proposed surgery:  Endometrial ablation with [place]erva.  Past Medical History:  Diagnosis Date  . Anemia   . Anxiety   . Cancer ([place])   . HPV in female   . Mental disorder   . Shoulder pain    Past Surgical History:  Procedure Laterality Date  . TOE SURGERY    . TUBAL LIGATION     OB History  Gravida Para Term Preterm AB Living  3 3 3     3   SAB TAB Ectopic Multiple Live Births          3    # Outcome Date GA Lbr Len/2nd Weight Sex Delivery Anes PTL Lv  3 Term         LIV  2 Term         LIV  1 Term         LIV  Patient denies any other pertinent gynecologic issues.   Current Outpatient Medications on File Prior to Visit  Medication Sig Dispense Refill  . ferrous sulfate 325 (65 FE) MG tablet Take 325 mg by mouth daily with breakfast.    . medroxyPROGESTERone (DEPO-PROVERA) 150 MG/ML injection Inject 1 mL (150 mg total) into the muscle every 3 (three) months. 1 mL 3  . megestrol (MEGACE) 40 MG tablet Take 3 for 5 days then 2 x 5 days then 1 daily 45 tablet 1   Current Facility-Administered Medications on File Prior to Visit  Medication Dose Route Frequency Provider Last Rate Last Dose  . ALPRAZolam Duanne Moron) tablet 0.5 mg  0.5 mg Oral Once Farrel Gobble, MD       Allergies  Allergen Reactions  . Doxycycline Calcium   .  Doxycycline Monohydrate   . Tramadol     "hallucinations"    Social History:   reports that she has been smoking cigarettes.  She has a 8.00 pack-year smoking history. She has never used smokeless tobacco. She reports that she does not drink alcohol or use drugs.  Family History  Problem Relation Age of Onset  . Hypertension Paternal Grandfather   . Cancer Maternal Grandmother   . Diabetes Maternal Grandfather   . Hypertension Father   . Ovarian cancer Mother   . Cancer Mother        cervix  . Hypertension Mother   . Diabetes Sister   . Hypertension Sister   . Bipolar disorder Daughter   . Tourette syndrome Son   . ADD / ADHD Son     Review of Systems: Noncontributory  PHYSICAL EXAM: Blood pressure 138/80, pulse 81, height 5\' 2"  (1.575 m), weight 167 lb (75.8 kg). General appearance - alert, well appearing, and in no distress Chest - clear to auscultation, no wheezes, rales or rhonchi, symmetric air entry Heart - normal rate and regular rhythm Abdomen -  soft, nontender, nondistended, no masses or organomegaly Pelvic exam: normal external genitalia, vulva, vagina, cervix, uterus and adnexa, VULVA: normal appearing vulva with no masses, tenderness or lesions, VAGINA: normal appearing vagina with normal color and discharge, no lesions, CERVIX: normal appearing cervix without discharge or lesions, UTERUS: uterus is normal size, shape, consistency and nontender, anteverted, ADNEXA: normal adnexa in size, nontender and no masses. Extremities - peripheral pulses normal, no pedal edema, no clubbing or cyanosis   Labs:  Imaging Studies:   Assessment: Patient Active Problem List   Diagnosis Date Noted  . Encounter for menstrual regulation 03/31/2018  . History of menorrhagia 03/31/2018  . Irregular intermenstrual bleeding 03/31/2018  . Constipation 09/27/2017  . Hemorrhoids 09/27/2017  . Dysmenorrhea 09/27/2017  . Breast mass, right 09/29/2016  . Cervical high risk HPV (human  papillomavirus) test positive 09/28/2015  . Pelvic pain in female 09/24/2015  . Irregular menses 09/24/2015  . Smoker 09/24/2015  . Concussion, 3 episodes the last 2 years ago 05/02/2014  . Syncope 05/02/2014  . Iron deficiency anemia 03/14/2014  . Hand fracture 04/27/2012    Plan: Patient will undergo surgical management with endometrial ablation.  Brochure in pt possession, questions answered to pt satisfaction.  Jonnie Kind, MD 05/13/2018 12:07 PM  By signing my name below, I, [name], attest that this documentation has been prepared under the direction and in the presence of Jonnie Kind, MD. Electronically Signed: [place]. 05/13/18. 12:08 PM.  I personally performed the services described in this documentation, which was SCRIBED in my presence. The recorded information has been reviewed and considered accurate. It has been edited as necessary during review. Jonnie Kind, MD

## 2018-05-15 LAB — GC/CHLAMYDIA PROBE AMP
Chlamydia trachomatis, NAA: NEGATIVE
Neisseria gonorrhoeae by PCR: NEGATIVE

## 2018-05-25 ENCOUNTER — Other Ambulatory Visit (HOSPITAL_COMMUNITY): Payer: Self-pay

## 2018-05-30 NOTE — Patient Instructions (Signed)
Mandy Garcia  05/30/2018     @PREFPERIOPPHARMACY @   Your procedure is scheduled on  06/07/2018 .  Report to [facility] at  615  A.M.  Call this number if you have problems the morning of surgery:  [phone]   Remember:  Do not eat or drink after midnight.  You may drink clear liquids until 12 midnight 06/06/2018 .  Clear liquids allowed are:                    Water, Juice (non-citric and without pulp), Carbonated beverages, Clear Tea, Black Coffee only, Plain Jell-O only, Gatorade and Plain Popsicles only    Take these medicines the morning of surgery with A SIP OF WATER None    Do not wear jewelry, make-up or nail polish.  Do not wear lotions, powders, or perfumes, or deodorant.  Do not shave 48 hours prior to surgery.  Men may shave face and neck.  Do not bring valuables to the hospital.  [facility] is not responsible for any belongings or valuables.  Contacts, dentures or bridgework may not be worn into surgery.  Leave your suitcase in the car.  After surgery it may be brought to your room.  For patients admitted to the hospital, discharge time will be determined by your treatment team.  Patients discharged the day of surgery will not be allowed to drive home.   Name and phone number of your driver:   family Special instructions:  None  Please read over the following fact sheets that you were given. Anesthesia Post-op Instructions and Care and Recovery After Surgery       Dilation and Curettage or Vacuum Curettage Dilation and curettage (D&C) and vacuum curettage are minor procedures. A D&C involves stretching (dilation) the cervix and scraping (curettage) the inside lining of the uterus (endometrium). During a D&C, tissue is gently scraped from the endometrium, starting from the top portion of the uterus down to the lowest part of the uterus (cervix). During a vacuum curettage, the lining and tissue in the uterus are removed with  the use of gentle suction. Curettage may be performed to either diagnose or treat a problem. As a diagnostic procedure, curettage is performed to examine tissues from the uterus. A diagnostic curettage may be done if you have:  Irregular bleeding in the uterus.  Bleeding with the development of clots.  Spotting between menstrual periods.  Prolonged menstrual periods or other abnormal bleeding.  Bleeding after menopause.  No menstrual period (amenorrhea).  A change in size and shape of the uterus.  Abnormal endometrial cells discovered during a Pap test.  As a treatment procedure, curettage may be performed for the following reasons:  Removal of an IUD (intrauterine device).  Removal of retained placenta after giving birth.  Abortion.  Miscarriage.  Removal of endometrial polyps.  Removal of uncommon types of noncancerous lumps (fibroids).  Tell a health care provider about:  Any allergies you have, including allergies to prescribed medicine or latex.  All medicines you are taking, including vitamins, herbs, eye drops, creams, and over-the-counter medicines. This is especially important if you take any blood-thinning medicine. Bring a list of all of your medicines to your appointment.  Any problems you or family members have had with anesthetic medicines.  Any blood disorders you have.  Any surgeries you have had.  Your medical history and any medical conditions you  have.  Whether you are pregnant or may be pregnant.  Recent vaginal infections you have had.  Recent menstrual periods, bleeding problems you have had, and what form of birth control (contraception) you use. What are the risks? Generally, this is a safe procedure. However, problems may occur, including:  Infection.  Heavy vaginal bleeding.  Allergic reactions to medicines.  Damage to the cervix or other structures or organs.  Development of scar tissue (adhesions) inside the uterus, which can  cause abnormal amounts of menstrual bleeding. This may make it harder to get pregnant in the future.  A hole (perforation) or puncture in the uterine wall. This is rare.  What happens before the procedure? Staying hydrated Follow instructions from your health care provider about hydration, which may include:  Up to 2 hours before the procedure - you may continue to drink clear liquids, such as water, clear fruit juice, black coffee, and plain tea.  Eating and drinking restrictions Follow instructions from your health care provider about eating and drinking, which may include:  8 hours before the procedure - stop eating heavy meals or foods such as meat, fried foods, or fatty foods.  6 hours before the procedure - stop eating light meals or foods, such as toast or cereal.  6 hours before the procedure - stop drinking milk or drinks that contain milk.  2 hours before the procedure - stop drinking clear liquids. If your health care provider told you to take your medicine(s) on the day of your procedure, take them with only a sip of water.  Medicines  Ask your health care provider about: ? Changing or stopping your regular medicines. This is especially important if you are taking diabetes medicines or blood thinners. ? Taking medicines such as aspirin and ibuprofen. These medicines can thin your blood. Do not take these medicines before your procedure if your health care provider instructs you not to.  You may be given antibiotic medicine to help prevent infection. General instructions  For 24 hours before your procedure, do not: ? Douche. ? Use tampons. ? Use medicines, creams, or suppositories in the vagina. ? Have sexual intercourse.  You may be given a pregnancy test on the day of the procedure.  Plan to have someone take you home from the hospital or clinic.  You may have a blood or urine sample taken.  If you will be going home right after the procedure, plan to have  someone with you for 24 hours. What happens during the procedure?  To reduce your risk of infection: ? Your health care team will wash or sanitize their hands. ? Your skin will be washed with soap.  An IV tube will be inserted into one of your veins.  You will be given one of the following: ? A medicine that numbs the area in and around the cervix (local anesthetic). ? A medicine to make you fall asleep (general anesthetic).  You will lie down on your back, with your feet in foot rests (stirrups).  The size and position of your uterus will be checked.  A lubricated instrument (speculum or Sims retractor) will be inserted into the back side of your vagina. The speculum will be used to hold apart the walls of your vagina so your health care provider can see your cervix.  A tool (tenaculum) will be attached to the lip of the cervix to stabilize it.  Your cervix will be softened and dilated. This may be done by: ? Taking  a medicine. ? Having tapered dilators or thin rods (laminaria) or gradual widening instruments (tapered dilators) inserted into your cervix.  A small, sharp, curved instrument (curette) will be used to scrape a small amount of tissue or cells from the endometrium or cervical canal. In some cases, gentle suction is applied with the curette. The curette will then be removed. The cells will be taken to a lab for testing. The procedure may vary among health care providers and hospitals. What happens after the procedure?  You may have mild cramping, backache, pain, and light bleeding or spotting. You may pass small blood clots from your vagina.  You may have to wear compression stockings. These stockings help to prevent blood clots and reduce swelling in your legs.  Your blood pressure, heart rate, breathing rate, and blood oxygen level will be monitored until the medicines you were given have worn off. Summary  Dilation and curettage (D&C) involves stretching (dilation)  the cervix and scraping (curettage) the inside lining of the uterus (endometrium).  After the procedure, you may have mild cramping, backache, pain, and light bleeding or spotting. You may pass small blood clots from your vagina.  Plan to have someone take you home from the hospital or clinic. This information is not intended to replace advice given to you by your health care provider. Make sure you discuss any questions you have with your health care provider. Document Released: 11/30/2005 Document Revised: 08/16/2016 Document Reviewed: 08/16/2016 Elsevier Interactive Patient Education  2018 [employer].  Dilation and Curettage or Vacuum Curettage, Care After These instructions give you information about caring for yourself after your procedure. Your doctor may also give you more specific instructions. Call your doctor if you have any problems or questions after your procedure. Follow these instructions at home: Activity  Do not drive or use heavy machinery while taking prescription pain medicine.  For 24 hours after your procedure, avoid driving.  Take short walks often, followed by rest periods. Ask your doctor what activities are safe for you. After one or two days, you may be able to return to your normal activities.  Do not lift anything that is heavier than 10 lb (4.5 kg) until your doctor approves.  For at least 2 weeks, or as long as told by your doctor: ? Do not douche. ? Do not use tampons. ? Do not have sex. General instructions  Take over-the-counter and prescription medicines only as told by your doctor. This is very important if you take blood thinning medicine.  Do not take baths, swim, or use a hot tub until your doctor approves. Take showers instead of baths.  Wear compression stockings as told by your doctor.  It is up to you to get the results of your procedure. Ask your doctor when your results will be ready.  Keep all follow-up visits as told by your  doctor. This is important. Contact a doctor if:  You have very bad cramps that get worse or do not get better with medicine.  You have very bad pain in your belly (abdomen).  You cannot drink fluids without throwing up (vomiting).  You get pain in a different part of the area between your belly and thighs (pelvis).  You have bad-smelling discharge from your vagina.  You have a rash. Get help right away if:  You are bleeding a lot from your vagina. A lot of bleeding means soaking more than one sanitary pad in an hour, for 2 hours in a row.  You have clumps of blood (blood clots) coming from your vagina.  You have a fever or chills.  Your belly feels very tender or hard.  You have chest pain.  You have trouble breathing.  You cough up blood.  You feel dizzy.  You feel light-headed.  You pass out (faint).  You have pain in your neck or shoulder area. Summary  Take short walks often, followed by rest periods. Ask your doctor what activities are safe for you. After one or two days, you may be able to return to your normal activities.  Do not lift anything that is heavier than 10 lb (4.5 kg) until your doctor approves.  Do not take baths, swim, or use a hot tub until your doctor approves. Take showers instead of baths.  Contact your doctor if you have any symptoms of infection, like bad-smelling discharge from your vagina. This information is not intended to replace advice given to you by your health care provider. Make sure you discuss any questions you have with your health care provider. Document Released: 09/08/2008 Document Revised: 08/17/2016 Document Reviewed: 08/17/2016 Elsevier Interactive Patient Education  2017 [place]. Hysteroscopy Hysteroscopy is a procedure used for looking inside the womb (uterus). It may be done for various reasons, including:  To evaluate abnormal bleeding, fibroid (benign, noncancerous) tumors, polyps, scar tissue (adhesions), and  possibly cancer of the uterus.  To look for lumps (tumors) and other uterine growths.  To look for causes of why a woman cannot get pregnant (infertility), causes of recurrent loss of pregnancy (miscarriages), or a lost intrauterine device (IUD).  To perform a sterilization by blocking the fallopian tubes from inside the uterus.  In this procedure, a thin, flexible tube with a tiny light and camera on the end of it (hysteroscope) is used to look inside the uterus. A hysteroscopy should be done right after a menstrual period to be sure you are not pregnant. LET [facility] CARE PROVIDER KNOW ABOUT:  Any allergies you have.  All medicines you are taking, including vitamins, herbs, eye drops, creams, and over-the-counter medicines.  Previous problems you or members of your family have had with the use of anesthetics.  Any blood disorders you have.  Previous surgeries you have had.  Medical conditions you have. RISKS AND COMPLICATIONS Generally, this is a safe procedure. However, as with any procedure, complications can occur. Possible complications include:  Putting a hole in the uterus.  Excessive bleeding.  Infection.  Damage to the cervix.  Injury to other organs.  Allergic reaction to medicines.  Too much fluid used in the uterus for the procedure.  BEFORE THE PROCEDURE  Ask your health care provider about changing or stopping any regular medicines.  Do not take aspirin or blood thinners for 1 week before the procedure, or as directed by your health care provider. These can cause bleeding.  If you smoke, do not smoke for 2 weeks before the procedure.  In some cases, a medicine is placed in the cervix the day before the procedure. This medicine makes the cervix have a larger opening (dilate). This makes it easier for the instrument to be inserted into the uterus during the procedure.  Do not eat or drink anything for at least 8 hours before the surgery.  Arrange for  someone to take you home after the procedure. PROCEDURE  You may be given a medicine to relax you (sedative). You may also be given one of the following: ? A medicine that  numbs the area around the cervix (local anesthetic). ? A medicine that makes you sleep through the procedure (general anesthetic).  The hysteroscope is inserted through the vagina into the uterus. The camera on the hysteroscope sends a picture to a TV screen. This gives the surgeon a good view inside the uterus.  During the procedure, air or a liquid is put into the uterus, which allows the surgeon to see better.  Sometimes, tissue is gently scraped from inside the uterus. These tissue samples are sent to a lab for testing. What to expect after the procedure  If you had a general anesthetic, you may be groggy for a couple hours after the procedure.  If you had a local anesthetic, you will be able to go home as soon as you are stable and feel ready.  You may have some cramping. This normally lasts for a couple days.  You may have bleeding, which varies from light spotting for a few days to menstrual-like bleeding for 3-7 days. This is normal.  If your test results are not back during the visit, make an appointment with your health care provider to find out the results. This information is not intended to replace advice given to you by your health care provider. Make sure you discuss any questions you have with your health care provider. Document Released: 03/08/2001 Document Revised: 05/07/2016 Document Reviewed: 06/29/2013 Elsevier Interactive Patient Education  2017 [place]. Hysteroscopy, Care After Refer to this sheet in the next few weeks. These instructions provide you with information on caring for yourself after your procedure. Your health care provider may also give you more specific instructions. Your treatment has been planned according to current medical practices, but problems sometimes occur. Call your  health care provider if you have any problems or questions after your procedure. What can I expect after the procedure? After your procedure, it is typical to have the following:  You may have some cramping. This normally lasts for a couple days.  You may have bleeding. This can vary from light spotting for a few days to menstrual-like bleeding for 3-7 days.  Follow these instructions at home:  Rest for the first 1-2 days after the procedure.  Only take over-the-counter or prescription medicines as directed by your health care provider. Do not take aspirin. It can increase the chances of bleeding.  Take showers instead of baths for 2 weeks or as directed by your health care provider.  Do not drive for 24 hours or as directed.  Do not drink alcohol while taking pain medicine.  Do not use tampons, douche, or have sexual intercourse for 2 weeks or until your health care provider says it is okay.  Take your temperature twice a day for 4-5 days. Write it down each time.  Follow your health care provider's advice about diet, exercise, and lifting.  If you develop constipation, you may: ? Take a mild laxative if your health care provider approves. ? Add bran foods to your diet. ? Drink enough fluids to keep your urine clear or pale yellow.  Try to have someone with you or available to you for the first 24-48 hours, especially if you were given a general anesthetic.  Follow up with your health care provider as directed. Contact a health care provider if:  You feel dizzy or lightheaded.  You feel sick to your stomach (nauseous).  You have abnormal vaginal discharge.  You have a rash.  You have pain that is not controlled  with medicine. Get help right away if:  You have bleeding that is heavier than a normal menstrual period.  You have a fever.  You have increasing cramps or pain, not controlled with medicine.  You have new belly (abdominal) pain.  You pass out.  You  have pain in the tops of your shoulders (shoulder strap areas).  You have shortness of breath. This information is not intended to replace advice given to you by your health care provider. Make sure you discuss any questions you have with your health care provider. Document Released: 09/20/2013 Document Revised: 05/07/2016 Document Reviewed: 06/29/2013 Elsevier Interactive Patient Education  2017 [place].  Endometrial Ablation Endometrial ablation is a procedure that destroys the thin inner layer of the lining of the uterus (endometrium). This procedure may be done:  To stop heavy periods.  To stop bleeding that is causing anemia.  To control irregular bleeding.  To treat bleeding caused by small tumors (fibroids) in the endometrium.  This procedure is often an alternative to major surgery, such as removal of the uterus and cervix (hysterectomy). As a result of this procedure:  You may not be able to have children. However, if you are premenopausal (you have not gone through menopause): ? You may still have a small chance of getting pregnant. ? You will need to use a reliable method of birth control after the procedure to prevent pregnancy.  You may stop having a menstrual period, or you may have only a small amount of bleeding during your period. Menstruation may return several years after the procedure.  Tell a health care provider about:  Any allergies you have.  All medicines you are taking, including vitamins, herbs, eye drops, creams, and over-the-counter medicines.  Any problems you or family members have had with the use of anesthetic medicines.  Any blood disorders you have.  Any surgeries you have had.  Any medical conditions you have. What are the risks? Generally, this is a safe procedure. However, problems may occur, including:  A hole (perforation) in the uterus or bowel.  Infection of the uterus, bladder, or vagina.  Bleeding.  Damage to other  structures or organs.  An air bubble in the lung (air embolus).  Problems with pregnancy after the procedure.  Failure of the procedure.  Decreased ability to diagnose cancer in the endometrium.  What happens before the procedure?  You will have tests of your endometrium to make sure there are no pre-cancerous cells or cancer cells present.  You may have an ultrasound of the uterus.  You may be given medicines to thin the endometrium.  Ask your health care provider about: ? Changing or stopping your regular medicines. This is especially important if you take diabetes medicines or blood thinners. ? Taking medicines such as aspirin and ibuprofen. These medicines can thin your blood. Do not take these medicines before your procedure if your doctor tells you not to.  Plan to have someone take you home from the hospital or clinic. What happens during the procedure?  You will lie on an exam table with your feet and legs supported as in a pelvic exam.  To lower your risk of infection: ? Your health care team will wash or sanitize their hands and put on germ-free (sterile) gloves. ? Your genital area will be washed with soap.  An IV tube will be inserted into one of your veins.  You will be given a medicine to help you relax (sedative).  A surgical  instrument with a light and camera (resectoscope) will be inserted into your vagina and moved into your uterus. This allows your surgeon to see inside your uterus.  Endometrial tissue will be removed using one of the following methods: ? Radiofrequency. This method uses a radiofrequency-alternating electric current to remove the endometrium. ? Cryotherapy. This method uses extreme cold to freeze the endometrium. ? Heated-free liquid. This method uses a heated saltwater (saline) solution to remove the endometrium. ? Microwave. This method uses high-energy microwaves to heat up the endometrium and remove it. ? Thermal balloon. This method  involves inserting a catheter with a balloon tip into the uterus. The balloon tip is filled with heated fluid to remove the endometrium. The procedure may vary among health care providers and hospitals. What happens after the procedure?  Your blood pressure, heart rate, breathing rate, and blood oxygen level will be monitored until the medicines you were given have worn off.  As tissue healing occurs, you may notice vaginal bleeding for 4-6 weeks after the procedure. You may also experience: ? Cramps. ? Thin, watery vaginal discharge that is light pink or brown in color. ? A need to urinate more frequently than usual. ? Nausea.  Do not drive for 24 hours if you were given a sedative.  Do not have sex or insert anything into your vagina until your health care provider approves. Summary  Endometrial ablation is done to treat the many causes of heavy menstrual bleeding.  The procedure may be done only after medications have been tried to control the bleeding.  Plan to have someone take you home from the hospital or clinic. This information is not intended to replace advice given to you by your health care provider. Make sure you discuss any questions you have with your health care provider. Document Released: 10/09/2004 Document Revised: 12/17/2016 Document Reviewed: 12/17/2016 Elsevier Interactive Patient Education  2017 [place] Anesthesia, Adult General anesthesia is the use of medicines to make a person "go to sleep" (be unconscious) for a medical procedure. General anesthesia is often recommended when a procedure:  Is long.  Requires you to be still or in an unusual position.  Is major and can cause you to lose blood.  Is impossible to do without general anesthesia.  The medicines used for general anesthesia are called general anesthetics. In addition to making you sleep, the medicines:  Prevent pain.  Control your blood pressure.  Relax your  muscles.  Tell a health care provider about:  Any allergies you have.  All medicines you are taking, including vitamins, herbs, eye drops, creams, and over-the-counter medicines.  Any problems you or family members have had with anesthetic medicines.  Types of anesthetics you have had in the past.  Any bleeding disorders you have.  Any surgeries you have had.  Any medical conditions you have.  Any history of heart or lung conditions, such as heart failure, sleep apnea, or chronic obstructive pulmonary disease (COPD).  Whether you are pregnant or may be pregnant.  Whether you use tobacco, alcohol, marijuana, or street drugs.  Any history of Armed forces logistics/support/administrative officer.  Any history of depression or anxiety. What are the risks? Generally, this is a safe procedure. However, problems may occur, including:  Allergic reaction to anesthetics.  Lung and heart problems.  Inhaling food or liquids from your stomach into your lungs (aspiration).  Injury to nerves.  Waking up during your procedure and being unable to move (rare).  Extreme agitation or a  state of mental confusion (delirium) when you wake up from the anesthetic.  Air in the bloodstream, which can lead to stroke.  These problems are more likely to develop if you are having a major surgery or if you have an advanced medical condition. You can prevent some of these complications by answering all of your health care provider's questions thoroughly and by following all pre-procedure instructions. General anesthesia can cause side effects, including:  Nausea or vomiting  A sore throat from the breathing tube.  Feeling cold or shivery.  Feeling tired, washed out, or achy.  Sleepiness or drowsiness.  Confusion or agitation.  What happens before the procedure? Staying hydrated Follow instructions from your health care provider about hydration, which may include:  Up to 2 hours before the procedure - you may continue to  drink clear liquids, such as water, clear fruit juice, black coffee, and plain tea.  Eating and drinking restrictions Follow instructions from your health care provider about eating and drinking, which may include:  8 hours before the procedure - stop eating heavy meals or foods such as meat, fried foods, or fatty foods.  6 hours before the procedure - stop eating light meals or foods, such as toast or cereal.  6 hours before the procedure - stop drinking milk or drinks that contain milk.  2 hours before the procedure - stop drinking clear liquids.  Medicines  Ask your health care provider about: ? Changing or stopping your regular medicines. This is especially important if you are taking diabetes medicines or blood thinners. ? Taking medicines such as aspirin and ibuprofen. These medicines can thin your blood. Do not take these medicines before your procedure if your health care provider instructs you not to. ? Taking new dietary supplements or medicines. Do not take these during the week before your procedure unless your health care provider approves them.  If you are told to take a medicine or to continue taking a medicine on the day of the procedure, take the medicine with sips of water. General instructions   Ask if you will be going home the same day, the following day, or after a longer hospital stay. ? Plan to have someone take you home. ? Plan to have someone stay with you for the first 24 hours after you leave the hospital or clinic.  For 3-6 weeks before the procedure, try not to use any tobacco products, such as cigarettes, chewing tobacco, and e-cigarettes.  You may brush your teeth on the morning of the procedure, but make sure to spit out the toothpaste. What happens during the procedure?  You will be given anesthetics through a mask and through an IV tube in one of your veins.  You may receive medicine to help you relax (sedative).  As soon as you are asleep, a  breathing tube may be used to help you breathe.  An anesthesia specialist will stay with you throughout the procedure. He or she will help keep you comfortable and safe by continuing to give you medicines and adjusting the amount of medicine that you get. He or she will also watch your blood pressure, pulse, and oxygen levels to make sure that the anesthetics do not cause any problems.  If a breathing tube was used to help you breathe, it will be removed before you wake up. The procedure may vary among health care providers and hospitals. What happens after the procedure?  You will wake up, often slowly, after the procedure is  complete, usually in a recovery area.  Your blood pressure, heart rate, breathing rate, and blood oxygen level will be monitored until the medicines you were given have worn off.  You may be given medicine to help you calm down if you feel anxious or agitated.  If you will be going home the same day, your health care provider may check to make sure you can stand, drink, and urinate.  Your health care providers will treat your pain and side effects before you go home.  Do not drive for 24 hours if you received a sedative.  You may: ? Feel nauseous and vomit. ? Have a sore throat. ? Have mental slowness. ? Feel cold or shivery. ? Feel sleepy. ? Feel tired. ? Feel sore or achy, even in parts of your body where you did not have surgery. This information is not intended to replace advice given to you by your health care provider. Make sure you discuss any questions you have with your health care provider. Document Released: 03/08/2008 Document Revised: 05/12/2016 Document Reviewed: 11/14/2015 Elsevier Interactive Patient Education  2018 [place] Anesthesia, Adult, Care After These instructions provide you with information about caring for yourself after your procedure. Your health care provider may also give you more specific instructions. Your  treatment has been planned according to current medical practices, but problems sometimes occur. Call your health care provider if you have any problems or questions after your procedure. What can I expect after the procedure? After the procedure, it is common to have:  Vomiting.  A sore throat.  Mental slowness.  It is common to feel:  Nauseous.  Cold or shivery.  Sleepy.  Tired.  Sore or achy, even in parts of your body where you did not have surgery.  Follow these instructions at home: For at least 24 hours after the procedure:  Do not: ? Participate in activities where you could fall or become injured. ? Drive. ? Use heavy machinery. ? Drink alcohol. ? Take sleeping pills or medicines that cause drowsiness. ? Make important decisions or sign legal documents. ? Take care of children on your own.  Rest. Eating and drinking  If you vomit, drink water, juice, or soup when you can drink without vomiting.  Drink enough fluid to keep your urine clear or pale yellow.  Make sure you have little or no nausea before eating solid foods.  Follow the diet recommended by your health care provider. General instructions  Have a responsible adult stay with you until you are awake and alert.  Return to your normal activities as told by your health care provider. Ask your health care provider what activities are safe for you.  Take over-the-counter and prescription medicines only as told by your health care provider.  If you smoke, do not smoke without supervision.  Keep all follow-up visits as told by your health care provider. This is important. Contact a health care provider if:  You continue to have nausea or vomiting at home, and medicines are not helpful.  You cannot drink fluids or start eating again.  You cannot urinate after 8-12 hours.  You develop a skin rash.  You have fever.  You have increasing redness at the site of your procedure. Get help right  away if:  You have difficulty breathing.  You have chest pain.  You have unexpected bleeding.  You feel that you are having a life-threatening or urgent problem. This information is not intended to replace advice  given to you by your health care provider. Make sure you discuss any questions you have with your health care provider. Document Released: 03/08/2001 Document Revised: 05/04/2016 Document Reviewed: 11/14/2015 Elsevier Interactive Patient Education  [employer].

## 2018-06-01 ENCOUNTER — Other Ambulatory Visit: Payer: Self-pay | Admitting: Obstetrics and Gynecology

## 2018-06-01 ENCOUNTER — Encounter (HOSPITAL_COMMUNITY)
Admission: RE | Admit: 2018-06-01 | Discharge: 2018-06-01 | Disposition: A | Discharge status: Home / Self Care | Payer: Medicaid Other | Source: Ambulatory Visit | Attending: Obstetrics and Gynecology | Admitting: Obstetrics and Gynecology

## 2018-06-01 ENCOUNTER — Other Ambulatory Visit: Payer: Self-pay

## 2018-06-01 ENCOUNTER — Encounter (HOSPITAL_COMMUNITY): Payer: Self-pay

## 2018-06-01 DIAGNOSIS — Z01812 Encounter for preprocedural laboratory examination: Secondary | ICD-10-CM | POA: Diagnosis present

## 2018-06-01 LAB — URINALYSIS, ROUTINE W REFLEX MICROSCOPIC
BACTERIA UA: NONE SEEN
BILIRUBIN URINE: NEGATIVE
Glucose, UA: NEGATIVE mg/dL
Hgb urine dipstick: NEGATIVE
Ketones, ur: NEGATIVE mg/dL
Nitrite: NEGATIVE
PROTEIN: NEGATIVE mg/dL
SPECIFIC GRAVITY, URINE: 1.03 (ref 1.005–1.030)
pH: 5 (ref 5.0–8.0)

## 2018-06-01 LAB — COMPREHENSIVE METABOLIC PANEL
ALT: 21 U/L (ref 14–54)
AST: 16 U/L (ref 15–41)
Albumin: 3.9 g/dL (ref 3.5–5.0)
Alkaline Phosphatase: 65 U/L (ref 38–126)
Anion gap: 8 (ref 5–15)
BILIRUBIN TOTAL: 0.5 mg/dL (ref 0.3–1.2)
BUN: 10 mg/dL (ref 6–20)
CO2: 20 mmol/L — ABNORMAL LOW (ref 22–32)
CREATININE: 0.7 mg/dL (ref 0.44–1.00)
Calcium: 9 mg/dL (ref 8.9–10.3)
Chloride: 109 mmol/L (ref 101–111)
GFR calc Af Amer: >60 mL/min (ref >60)
Glucose, Bld: 61 mg/dL — ABNORMAL LOW (ref 65–99)
Potassium: 4 mmol/L (ref 3.5–5.1)
Sodium: 137 mmol/L (ref 135–145)
TOTAL PROTEIN: 6.8 g/dL (ref 6.5–8.1)

## 2018-06-01 LAB — CBC
HEMATOCRIT: 46.1 % — AB (ref 36.0–46.0)
Hemoglobin: 15.8 g/dL — ABNORMAL HIGH (ref 12.0–15.0)
MCH: 30.4 pg (ref 26.0–34.0)
MCHC: 34.3 g/dL (ref 30.0–36.0)
MCV: 88.8 fL (ref 78.0–100.0)
Platelets: 218 10*3/uL (ref 150–400)
RBC: 5.19 MIL/uL — AB (ref 3.87–5.11)
RDW: 14.1 % (ref 11.5–15.5)
WBC: 10.1 10*3/uL (ref 4.0–10.5)

## 2018-06-01 LAB — HCG, SERUM, QUALITATIVE: PREG SERUM: NEGATIVE

## 2018-06-07 ENCOUNTER — Ambulatory Visit (HOSPITAL_COMMUNITY): Payer: Medicaid Other | Admitting: Anesthesiology

## 2018-06-07 ENCOUNTER — Ambulatory Visit (HOSPITAL_COMMUNITY)
Admission: RE | Admit: 2018-06-07 | Discharge: 2018-06-07 | Disposition: A | Discharge status: Home / Self Care | Payer: Medicaid Other | Source: Ambulatory Visit | Attending: Obstetrics and Gynecology | Admitting: Obstetrics and Gynecology

## 2018-06-07 ENCOUNTER — Encounter (HOSPITAL_COMMUNITY)
Admission: RE | Disposition: A | Discharge status: Home / Self Care | Payer: Self-pay | Source: Ambulatory Visit | Attending: Obstetrics and Gynecology

## 2018-06-07 ENCOUNTER — Encounter (HOSPITAL_COMMUNITY): Payer: Self-pay | Admitting: *Deleted

## 2018-06-07 DIAGNOSIS — Z881 Allergy status to other antibiotic agents status: Secondary | ICD-10-CM | POA: Diagnosis not present

## 2018-06-07 DIAGNOSIS — F17210 Nicotine dependence, cigarettes, uncomplicated: Secondary | ICD-10-CM | POA: Insufficient documentation

## 2018-06-07 DIAGNOSIS — N946 Dysmenorrhea, unspecified: Secondary | ICD-10-CM | POA: Insufficient documentation

## 2018-06-07 DIAGNOSIS — Z79899 Other long term (current) drug therapy: Secondary | ICD-10-CM | POA: Insufficient documentation

## 2018-06-07 DIAGNOSIS — Z885 Allergy status to narcotic agent status: Secondary | ICD-10-CM | POA: Insufficient documentation

## 2018-06-07 DIAGNOSIS — Z8742 Personal history of other diseases of the female genital tract: Secondary | ICD-10-CM

## 2018-06-07 DIAGNOSIS — N921 Excessive and frequent menstruation with irregular cycle: Secondary | ICD-10-CM | POA: Diagnosis not present

## 2018-06-07 DIAGNOSIS — F419 Anxiety disorder, unspecified: Secondary | ICD-10-CM | POA: Insufficient documentation

## 2018-06-07 DIAGNOSIS — N926 Irregular menstruation, unspecified: Secondary | ICD-10-CM

## 2018-06-07 DIAGNOSIS — D649 Anemia, unspecified: Secondary | ICD-10-CM

## 2018-06-07 HISTORY — PX: DILITATION & CURRETTAGE/HYSTROSCOPY WITH NOVASURE ABLATION: SHX5568

## 2018-06-07 SURGERY — DILATATION & CURETTAGE/HYSTEROSCOPY WITH NOVASURE ABLATION
Anesthesia: General

## 2018-06-07 MED ORDER — LIDOCAINE HCL (PF) 1 % IJ SOLN
INTRAMUSCULAR | Status: AC
  Filled 2018-06-07: qty 15

## 2018-06-07 MED ORDER — EPHEDRINE SULFATE 50 MG/ML IJ SOLN
INTRAMUSCULAR | Status: AC
  Filled 2018-06-07: qty 1

## 2018-06-07 MED ORDER — KETOROLAC TROMETHAMINE 10 MG PO TABS: 10.0000 mg | ORAL_TABLET | Freq: Four times a day (QID) | ORAL | 0 refills | Status: DC

## 2018-06-07 MED ORDER — ROCURONIUM BROMIDE 50 MG/5ML IV SOLN
INTRAVENOUS | Status: AC
  Filled 2018-06-07: qty 2

## 2018-06-07 MED ORDER — FENTANYL CITRATE (PF) 100 MCG/2ML IJ SOLN
INTRAMUSCULAR | Status: DC | PRN
  Administered 2018-06-07 (×8): 25 ug via INTRAVENOUS

## 2018-06-07 MED ORDER — PROPOFOL 10 MG/ML IV BOLUS
INTRAVENOUS | Status: DC | PRN
  Administered 2018-06-07: 50 mg via INTRAVENOUS
  Administered 2018-06-07: 150 mg via INTRAVENOUS

## 2018-06-07 MED ORDER — ATROPINE SULFATE 0.4 MG/ML IJ SOLN
INTRAMUSCULAR | Status: AC
  Filled 2018-06-07: qty 1

## 2018-06-07 MED ORDER — 0.9 % SODIUM CHLORIDE (POUR BTL) OPTIME
TOPICAL | Status: DC | PRN
  Administered 2018-06-07: 1000 mL

## 2018-06-07 MED ORDER — PROPOFOL 10 MG/ML IV BOLUS
INTRAVENOUS | Status: AC
Start: 2018-06-07 — End: ?
  Filled 2018-06-07: qty 20

## 2018-06-07 MED ORDER — MEPERIDINE HCL 50 MG/ML IJ SOLN: 6.2500 mg | INTRAMUSCULAR | Status: DC | PRN

## 2018-06-07 MED ORDER — FENTANYL CITRATE (PF) 250 MCG/5ML IJ SOLN
INTRAMUSCULAR | Status: AC
  Filled 2018-06-07: qty 5

## 2018-06-07 MED ORDER — KETOROLAC TROMETHAMINE 30 MG/ML IJ SOLN
30.0000 mg | Freq: Once | INTRAMUSCULAR | Status: AC | PRN
  Administered 2018-06-07: 30 mg via INTRAVENOUS
  Filled 2018-06-07: qty 1

## 2018-06-07 MED ORDER — HYDROMORPHONE HCL 1 MG/ML IJ SOLN
0.2500 mg | INTRAMUSCULAR | Status: DC | PRN
  Administered 2018-06-07: 0.5 mg via INTRAVENOUS
  Filled 2018-06-07: qty 0.5

## 2018-06-07 MED ORDER — LIDOCAINE HCL (CARDIAC) PF 50 MG/5ML IV SOSY
PREFILLED_SYRINGE | INTRAVENOUS | Status: DC | PRN
  Administered 2018-06-07: 30 mg via INTRAVENOUS

## 2018-06-07 MED ORDER — SODIUM CHLORIDE 0.9 % IR SOLN
Status: DC | PRN
  Administered 2018-06-07: 3000 mL

## 2018-06-07 MED ORDER — MIDAZOLAM HCL 2 MG/2ML IJ SOLN
INTRAMUSCULAR | Status: AC
  Filled 2018-06-07: qty 2

## 2018-06-07 MED ORDER — ONDANSETRON HCL 4 MG/2ML IJ SOLN
4.0000 mg | Freq: Once | INTRAMUSCULAR | Status: AC | PRN
  Administered 2018-06-07: 4 mg via INTRAVENOUS
  Filled 2018-06-07: qty 2

## 2018-06-07 MED ORDER — BUPIVACAINE-EPINEPHRINE (PF) 0.5% -1:200000 IJ SOLN
INTRAMUSCULAR | Status: AC
  Filled 2018-06-07: qty 30

## 2018-06-07 MED ORDER — SODIUM CHLORIDE 0.9 % IJ SOLN
INTRAMUSCULAR | Status: AC
  Filled 2018-06-07: qty 10

## 2018-06-07 MED ORDER — HYDROCODONE-ACETAMINOPHEN 5-325 MG PO TABS: 1.0000 | ORAL_TABLET | Freq: Four times a day (QID) | ORAL | 0 refills | Status: DC | PRN

## 2018-06-07 MED ORDER — BUPIVACAINE-EPINEPHRINE (PF) 0.5% -1:200000 IJ SOLN
INTRAMUSCULAR | Status: DC | PRN
  Administered 2018-06-07: 18 mL

## 2018-06-07 MED ORDER — CEFAZOLIN SODIUM-DEXTROSE 2-4 GM/100ML-% IV SOLN
2.0000 g | INTRAVENOUS | Status: AC
  Administered 2018-06-07: 2 g via INTRAVENOUS
  Filled 2018-06-07: qty 100

## 2018-06-07 MED ORDER — HYDROCODONE-ACETAMINOPHEN 7.5-325 MG PO TABS
1.0000 | ORAL_TABLET | Freq: Once | ORAL | Status: AC | PRN
  Administered 2018-06-07: 1 via ORAL
  Filled 2018-06-07: qty 1

## 2018-06-07 MED ORDER — LACTATED RINGERS IV SOLN
INTRAVENOUS | Status: DC
  Administered 2018-06-07: 1000 mL via INTRAVENOUS

## 2018-06-07 SURGICAL SUPPLY — 25 items
CLOTH BEACON ORANGE TIMEOUT ST (SAFETY) ×3 IMPLANT
COVER LIGHT HANDLE STERIS (MISCELLANEOUS) ×6 IMPLANT
DECANTER SPIKE VIAL GLASS SM (MISCELLANEOUS) ×3 IMPLANT
GAUZE SPONGE 4X4 12PLY STRL (GAUZE/BANDAGES/DRESSINGS) ×3 IMPLANT
GLOVE BIOGEL PI IND STRL 7.0 (GLOVE) ×1 IMPLANT
GLOVE BIOGEL PI IND STRL 9 (GLOVE) ×1 IMPLANT
GLOVE BIOGEL PI INDICATOR 7.0 (GLOVE) ×2
GLOVE BIOGEL PI INDICATOR 9 (GLOVE) ×2
GLOVE ECLIPSE 6.5 STRL STRAW (GLOVE) ×2 IMPLANT
GLOVE ECLIPSE 9.0 STRL (GLOVE) ×3 IMPLANT
GOWN SPEC L3 XXLG W/TWL (GOWN DISPOSABLE) ×3 IMPLANT
GOWN STRL REUS W/TWL LRG LVL3 (GOWN DISPOSABLE) ×3 IMPLANT
HANDPIECE ABLA MINERVA ENDO (MISCELLANEOUS) ×2 IMPLANT
INST SET HYSTEROSCOPY (KITS) ×3 IMPLANT
IV NS IRRIG 3000ML ARTHROMATIC (IV SOLUTION) ×3 IMPLANT
KIT TURNOVER CYSTO (KITS) ×3 IMPLANT
KIT TURNOVER KIT A (KITS) ×3 IMPLANT
MANIFOLD NEPTUNE II (INSTRUMENTS) ×3 IMPLANT
NS IRRIG 1000ML POUR BTL (IV SOLUTION) ×3 IMPLANT
PACK PERI GYN (CUSTOM PROCEDURE TRAY) ×3 IMPLANT
PAD ARMBOARD 7.5X6 YLW CONV (MISCELLANEOUS) ×3 IMPLANT
PAD TELFA 3X4 1S STER (GAUZE/BANDAGES/DRESSINGS) ×3 IMPLANT
SET BASIN LINEN APH (SET/KITS/TRAYS/PACK) ×3 IMPLANT
SET IRRIG Y TYPE TUR BLADDER L (SET/KITS/TRAYS/PACK) ×3 IMPLANT
SYR CONTROL 10ML LL (SYRINGE) ×3 IMPLANT

## 2018-06-07 NOTE — Anesthesia Postprocedure Evaluation (Signed)
Anesthesia Post Note  Patient: Mandy Garcia  Procedure(s) Performed: DILATATION & CURETTAGE/HYSTEROSCOPY WITH MINERVA ABLATION (N/A )  Patient location during evaluation: PACU Anesthesia Type: General Level of consciousness: awake and alert and oriented Pain management: pain level controlled Vital Signs Assessment: post-procedure vital signs reviewed and stable Respiratory status: spontaneous breathing Cardiovascular status: blood pressure returned to baseline and stable Postop Assessment: no apparent nausea or vomiting Anesthetic complications: no     Last Vitals:  Vitals:   06/07/18 0830 06/07/18 0845  BP: 131/84 (!) 134/92  Pulse: 87 84  Resp: 13 15  Temp:    SpO2: 92% 92%    Last Pain:  Vitals:   06/07/18 0850  TempSrc:   PainSc: 9                  Javonne Louissaint

## 2018-06-07 NOTE — Anesthesia Preprocedure Evaluation (Signed)
Anesthesia Evaluation  Patient identified by MRN, date of birth, ID band Patient awake    Reviewed: Allergy & Precautions, H&P , NPO status , Patient's Chart, lab work & pertinent test results, reviewed documented beta blocker date and time   Airway Mallampati: II  TM Distance: >3 FB Neck ROM: full    Dental no notable dental hx.    Pulmonary neg pulmonary ROS, Current Smoker,    Pulmonary exam normal breath sounds clear to auscultation       Cardiovascular Exercise Tolerance: Good negative cardio ROS   Rhythm:regular Rate:Normal     Neuro/Psych Anxiety negative neurological ROS  negative psych ROS   GI/Hepatic negative GI ROS, Neg liver ROS,   Endo/Other  negative endocrine ROS  Renal/GU negative Renal ROS  negative genitourinary   Musculoskeletal   Abdominal   Peds  Hematology negative hematology ROS (+) anemia ,   Anesthesia Other Findings   Reproductive/Obstetrics negative OB ROS                             Anesthesia Physical Anesthesia Plan  ASA: II  Anesthesia Plan: General   Post-op Pain Management:    Induction:   PONV Risk Score and Plan:   Airway Management Planned:   Additional Equipment:   Intra-op Plan:   Post-operative Plan:   Informed Consent: I have reviewed the patients History and Physical, chart, labs and discussed the procedure including the risks, benefits and alternatives for the proposed anesthesia with the patient or authorized representative who has indicated his/her understanding and acceptance.   Dental Advisory Given  Plan Discussed with: CRNA  Anesthesia Plan Comments:         Anesthesia Quick Evaluation

## 2018-06-07 NOTE — Op Note (Signed)
Please see the brief operative note for surgical details 

## 2018-06-07 NOTE — Interval H&P Note (Signed)
History and Physical Interval Note:  06/07/2018 7:02 AM  [name]  has presented today for surgery, with the diagnosis of Irregular cycle Dysmenorrhea  The various methods of treatment have been discussed with the patient and family. After consideration of risks, benefits and other options for treatment, the patient has consented to  Procedure(s): DILATATION & CURETTAGE/HYSTEROSCOPY WITH MINERVA ABLATION (N/A) as a surgical intervention .Procedure reviewed again with patient, and pt denies any further questions.  The patient's history has been reviewed, patient examined, no change in status, stable for surgery.  I have reviewed the patient's chart and labs.  Questions were answered to the patient's satisfaction.     Mandy Garcia

## 2018-06-07 NOTE — Anesthesia Procedure Notes (Signed)
Procedure Name: LMA Insertion Performed by: Ollen Bowl, CRNA Pre-anesthesia Checklist: Patient identified, Patient being monitored, Emergency Drugs available, Timeout performed and Suction available Patient Re-evaluated:Patient Re-evaluated prior to induction Oxygen Delivery Method: Circle System Utilized Preoxygenation: Pre-oxygenation with 100% oxygen Induction Type: IV induction Ventilation: Mask ventilation without difficulty LMA: LMA inserted LMA Size: 3.0 Number of attempts: 1 Placement Confirmation: positive ETCO2 and breath sounds checked- equal and bilateral

## 2018-06-07 NOTE — Transfer of Care (Signed)
Immediate Anesthesia Transfer of Care Note  Patient: Mandy Garcia  Procedure(s) Performed: DILATATION & CURETTAGE/HYSTEROSCOPY WITH MINERVA ABLATION (N/A )  Patient Location: PACU  Anesthesia Type:General  Level of Consciousness: awake, alert  and oriented  Airway & Oxygen Therapy: Patient Spontanous Breathing  Post-op Assessment: Report given to RN and Post -op Vital signs reviewed and stable  Post vital signs: Reviewed and stable  Last Vitals:  Vitals Value Taken Time  BP 136/84 06/07/2018  8:22 AM  Temp    Pulse 96 06/07/2018  8:26 AM  Resp 16 06/07/2018  8:26 AM  SpO2 93 % 06/07/2018  8:26 AM  Vitals shown include unvalidated device data.  Last Pain:  Vitals:   06/07/18 0640  TempSrc: Oral  PainSc: 0-No pain      Patients Stated Pain Goal: 8 (22/17/98 1025)  Complications: No apparent anesthesia complications

## 2018-06-07 NOTE — H&P (Signed)
Preoperative History and Physical  Mandy Garcia is a 32 y.o. 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 Last seen in office 03/31/2018, was told to follow up for pre-op endometrial ablation. Her last pap was 2017 and normal. Pt reports that the depo isn't working. Pt reports that she had bleeding and last month she received megace that stopped the bleeding. Pt last received megace and depo this past month. Pt reports that she believes that the megace is giving her heartburn but is unsure of this. Pt has had tubal ligation almost 12 years ago. She has 3 children, ages 60, 72, and 45. No significant preoperative concerns.  Proposed surgery: Hysteroscopy, dilation and curettage, Endometrial ablation with Minerva.      Past Medical History:  Diagnosis Date  . Anemia   . Anxiety   . Cancer ([place])   . HPV in female   . Mental disorder   . Shoulder pain         Past Surgical History:  Procedure Laterality Date  . TOE SURGERY    . TUBAL LIGATION                     OB History  Gravida Para Term Preterm AB Living  3 3 3     3   SAB TAB Ectopic Multiple Live Births             3       # Outcome Date GA Lbr Len/2nd Weight Sex Delivery Anes PTL Lv  3 Term         LIV  2 Term         LIV  1 Term         LIV  Patient denies any other pertinent gynecologic issues.         Current Outpatient Medications on File Prior to Visit  Medication Sig Dispense Refill  . ferrous sulfate 325 (65 FE) MG tablet Take 325 mg by mouth daily with breakfast.    . medroxyPROGESTERone (DEPO-PROVERA) 150 MG/ML injection Inject 1 mL (150 mg total) into the muscle every 3 (three) months. 1 mL 3  . megestrol (MEGACE) 40 MG tablet Take 3 for 5 days then 2 x 5 days then 1 daily 45 tablet 1            Current Facility-Administered Medications on File Prior to Visit  Medication Dose Route Frequency Provider Last Rate Last Dose  . ALPRAZolam  Duanne Moron) tablet 0.5 mg  0.5 mg Oral Once Farrel Gobble, MD       Allergies  Allergen Reactions  . Doxycycline Calcium   . Doxycycline Monohydrate   . Tramadol     "hallucinations"    Social History:   reports that she has been smoking cigarettes.  She has a 8.00 pack-year smoking history. She has never used smokeless tobacco. She reports that she does not drink alcohol or use drugs.       Family History  Problem Relation Age of Onset  . Hypertension Paternal Grandfather   . Cancer Maternal Grandmother   . Diabetes Maternal Grandfather   . Hypertension Father   . Ovarian cancer Mother   . Cancer Mother        cervix  . Hypertension Mother   . Diabetes Sister   . Hypertension Sister   . Bipolar disorder Daughter   . Tourette syndrome Son   . ADD / ADHD Son  Review of Systems: Noncontributory  PHYSICAL EXAM: Blood pressure 138/80, pulse 81, height 5\' 2"  (1.575 m), weight 167 lb (75.8 kg). General appearance - alert, well appearing, and in no distress Chest - clear to auscultation, no wheezes, rales or rhonchi, symmetric air entry Heart - normal rate and regular rhythm Abdomen - soft, nontender, nondistended, no masses or organomegaly Pelvic exam: normal external genitalia, vulva, vagina, cervix, uterus and adnexa, VULVA: normal appearing vulva with no masses, tenderness or lesions, VAGINA: normal appearing vagina with normal color and discharge, no lesions, CERVIX: normal appearing cervix without discharge or lesions, UTERUS: uterus is normal size, shape, consistency and nontender, anteverted, ADNEXA: normal adnexa in size, nontender and no masses. Extremities - peripheral pulses normal, no pedal edema, no clubbing or cyanosis   Labs: CBC    Component Value Date/Time   WBC 10.1 06/01/2018 1252   RBC 5.19 (H) 06/01/2018 1252   HGB 15.8 (H) 06/01/2018 1252   HGB 15.5 09/27/2017 0933   HCT 46.1 (H) 06/01/2018 1252   HCT 46.5 09/27/2017  0933   PLT 218 06/01/2018 1252   PLT 241 09/27/2017 0933   MCV 88.8 06/01/2018 1252   MCV 87 09/27/2017 0933   MCH 30.4 06/01/2018 1252   MCHC 34.3 06/01/2018 1252   RDW 14.1 06/01/2018 1252   RDW 14.9 09/27/2017 0933   LYMPHSABS 2.3 05/02/2014 1350   MONOABS 0.5 05/02/2014 1350   EOSABS 0.1 05/02/2014 1350   BASOSABS 0.0 05/02/2014 1350   CMP     Component Value Date/Time   NA 137 06/01/2018 1252   NA 137 09/24/2016 0950   K 4.0 06/01/2018 1252   CL 109 06/01/2018 1252   CO2 20 (L) 06/01/2018 1252   GLUCOSE 61 (L) 06/01/2018 1252   BUN 10 06/01/2018 1252   BUN 12 09/24/2016 0950   CREATININE 0.70 06/01/2018 1252   CALCIUM 9.0 06/01/2018 1252   PROT 6.8 06/01/2018 1252   PROT 6.9 09/24/2016 0950   ALBUMIN 3.9 06/01/2018 1252   ALBUMIN 4.4 09/24/2016 0950   AST 16 06/01/2018 1252   ALT 21 06/01/2018 1252   ALKPHOS 65 06/01/2018 1252   BILITOT 0.5 06/01/2018 1252   BILITOT 0.3 09/24/2016 0950   GFRNONAA >60 06/01/2018 1252   GFRAA >60 06/01/2018 1252     Imaging Studies: Mandy Garcia is a 32 y.o. G3P3003 LMP 07/25/2015 for a pelvic sonogram for bilat pelvic pain and irregular periods.  Uterus                      8.6 x 6 x 4.3 cm, normal anteverted uterus  Endometrium          7 mm, symmetrical, wnl  Right ovary             2.7 x 3.1 x 1.8 cm, wnl  Left ovary                3.0 x 3 x 2.22 cm, wnl    Technician Comments:   PELVIC US TA/TV: normal anteverted uterus,normal ov's bilat (mobile),bilat adnexal pain during ultrasound,no free fluid ,EEC 47mm   Mandy Garcia 09/30/2015 10:41 AM  Assessment:     Patient Active Problem List   Diagnosis Date Noted  . Encounter for menstrual regulation 03/31/2018  . History of menorrhagia 03/31/2018  . Irregular intermenstrual bleeding 03/31/2018  . Constipation 09/27/2017  . Hemorrhoids 09/27/2017  . Dysmenorrhea 09/27/2017  . Breast mass, right 09/29/2016  . Cervical high  risk HPV  (human papillomavirus) test positive 09/28/2015  . Pelvic pain in female 09/24/2015  . Irregular menses 09/24/2015  . Smoker 09/24/2015  . Concussion, 3 episodes the last 2 years ago 05/02/2014  . Syncope 05/02/2014  . Iron deficiency anemia 03/14/2014  . Hand fracture 04/27/2012    Plan: Patient will undergo surgical management with Hysteroscopy, dilation and curettage, endometrial ablation.  Brochure in pt possession, questions answered to pt satisfaction.  Jonnie Kind, MD 05/13/2018 12:07 PM  By signing my name below, I, [name], attest that this documentation has been prepared under the direction and in the presence of Jonnie Kind, MD. Electronically Signed: [place]. 05/13/18. 12:08 PM.  I personally performed the services described in this documentation, which was SCRIBED in my presence. The recorded information has been reviewed and considered accurate. It has been edited as necessary during review. Jonnie Kind, MD

## 2018-06-07 NOTE — Op Note (Signed)
06/07/2018  8:19 AM  PATIENT:  Mandy Garcia  32 y.o. female  PRE-OPERATIVE DIAGNOSIS:  Irregular cycle, history of anemia due to heavy menses Dysmenorrhea  POST-OPERATIVE DIAGNOSIS:  Irregular cycle history of heavy menses with anemia. Dysmenorrhea  PROCEDURE:  Procedure(s): DILATATION & CURETTAGE/HYSTEROSCOPY WITH MINERVA ABLATION (N/A)  SURGEON:  Surgeon(s) and Role:    * Jonnie Kind, MD - Primary  PHYSICIAN ASSISTANT:   ASSISTANTS: none   ANESTHESIA:   general and paracervical block  EBL:  25 mL   BLOOD ADMINISTERED:none  DRAINS: none   LOCAL MEDICATIONS USED:  MARCAINE    and Amount: 18 ml  SPECIMEN:  Source of Specimen:  endometrial currettings  DISPOSITION OF SPECIMEN:  PATHOLOGY  COUNTS:  YES  TOURNIQUET:  * No tourniquets in log *  DICTATION: .Dragon Dictation  PLAN OF CARE: Discharge to home after PACU  PATIENT DISPOSITION:  PACU - hemodynamically stable.   Delay start of Pharmacological VTE agent (>24hrs) due to surgical blood loss or risk of bleeding: not applicable Details of procedure: Patient was taken the operating room prepped and draped for vaginal procedure with legs in low lithotomy support.  Timeout was conducted and procedure confirmed by operative team.  Speculum was inserted, paracervical block applied with 18 cc of Marcaine injected in the paracervical fashion at 2,4,8 and 10:00, then the uterus sounded in the anteflexed position to 7.5 cm, dilated to 23 [place] allowing introduction of the rigid 30 degree operative hysteroscope which identified normal endometrial cavity with some slightly shaggy endometrium.  #1 curette was used to remove a small amount of tissue, then the Minerva endometrial ablation device activated set on 4 cm, placed and the 2-minute ablation sequence executed. Procedure was completed successfully without complications and patient allowed to go to recovery room.  She will be given Toradol for home care with a  small number of Vicodin for breakthrough pain

## 2018-06-07 NOTE — Discharge Instructions (Signed)
Hysteroscopy Hysteroscopy is a procedure used for looking inside the womb (uterus). It may be done for various reasons, including:  To evaluate abnormal bleeding, fibroid (benign, noncancerous) tumors, polyps, scar tissue (adhesions), and possibly cancer of the uterus.  To look for lumps (tumors) and other uterine growths.  To look for causes of why a woman cannot get pregnant (infertility), causes of recurrent loss of pregnancy (miscarriages), or a lost intrauterine device (IUD).  To perform a sterilization by blocking the fallopian tubes from inside the uterus.  In this procedure, a thin, flexible tube with a tiny light and camera on the end of it (hysteroscope) is used to look inside the uterus. A hysteroscopy should be done right after a menstrual period to be sure you are not pregnant. LET [facility] CARE PROVIDER KNOW ABOUT:  Any allergies you have.  All medicines you are taking, including vitamins, herbs, eye drops, creams, and over-the-counter medicines.  Previous problems you or members of your family have had with the use of anesthetics.  Any blood disorders you have.  Previous surgeries you have had.  Medical conditions you have. RISKS AND COMPLICATIONS Generally, this is a safe procedure. However, as with any procedure, complications can occur. Possible complications include:  Putting a hole in the uterus.  Excessive bleeding.  Infection.  Damage to the cervix.  Injury to other organs.  Allergic reaction to medicines.  Too much fluid used in the uterus for the procedure.  BEFORE THE PROCEDURE  Ask your health care provider about changing or stopping any regular medicines.  Do not take aspirin or blood thinners for 1 week before the procedure, or as directed by your health care provider. These can cause bleeding.  If you smoke, do not smoke for 2 weeks before the procedure.  In some cases, a medicine is placed in the cervix the day before the procedure.  This medicine makes the cervix have a larger opening (dilate). This makes it easier for the instrument to be inserted into the uterus during the procedure.  Do not eat or drink anything for at least 8 hours before the surgery.  Arrange for someone to take you home after the procedure. PROCEDURE  You may be given a medicine to relax you (sedative). You may also be given one of the following: ? A medicine that numbs the area around the cervix (local anesthetic). ? A medicine that makes you sleep through the procedure (general anesthetic).  The hysteroscope is inserted through the vagina into the uterus. The camera on the hysteroscope sends a picture to a TV screen. This gives the surgeon a good view inside the uterus.  During the procedure, air or a liquid is put into the uterus, which allows the surgeon to see better.  Sometimes, tissue is gently scraped from inside the uterus. These tissue samples are sent to a lab for testing. What to expect after the procedure  If you had a general anesthetic, you may be groggy for a couple hours after the procedure.  If you had a local anesthetic, you will be able to go home as soon as you are stable and feel ready.  You may have some cramping. This normally lasts for a couple days.  You may have bleeding, which varies from light spotting for a few days to menstrual-like bleeding for 3-7 days. This is normal.  If your test results are not back during the visit, make an appointment with your health care provider to find out the  results. This information is not intended to replace advice given to you by your health care provider. Make sure you discuss any questions you have with your health care provider. Document Released: 03/08/2001 Document Revised: 05/07/2016 Document Reviewed: 06/29/2013 Elsevier Interactive Patient Education  2017 [employer].  Endometrial Ablation Endometrial ablation is a procedure that destroys the thin inner layer of the  lining of the uterus (endometrium). This procedure may be done:  To stop heavy periods.  To stop bleeding that is causing anemia.  To control irregular bleeding.  To treat bleeding caused by small tumors (fibroids) in the endometrium.  This procedure is often an alternative to major surgery, such as removal of the uterus and cervix (hysterectomy). As a result of this procedure:  You may not be able to have children. However, if you are premenopausal (you have not gone through menopause): ? You may still have a small chance of getting pregnant. ? You will need to use a reliable method of birth control after the procedure to prevent pregnancy.  You may stop having a menstrual period, or you may have only a small amount of bleeding during your period. Menstruation may return several years after the procedure.  Tell a health care provider about:  Any allergies you have.  All medicines you are taking, including vitamins, herbs, eye drops, creams, and over-the-counter medicines.  Any problems you or family members have had with the use of anesthetic medicines.  Any blood disorders you have.  Any surgeries you have had.  Any medical conditions you have. What are the risks? Generally, this is a safe procedure. However, problems may occur, including:  A hole (perforation) in the uterus or bowel.  Infection of the uterus, bladder, or vagina.  Bleeding.  Damage to other structures or organs.  An air bubble in the lung (air embolus).  Problems with pregnancy after the procedure.  Failure of the procedure.  Decreased ability to diagnose cancer in the endometrium.  What happens before the procedure?  You will have tests of your endometrium to make sure there are no pre-cancerous cells or cancer cells present.  You may have an ultrasound of the uterus.  You may be given medicines to thin the endometrium.  Ask your health care provider about: ? Changing or stopping your  regular medicines. This is especially important if you take diabetes medicines or blood thinners. ? Taking medicines such as aspirin and ibuprofen. These medicines can thin your blood. Do not take these medicines before your procedure if your doctor tells you not to.  Plan to have someone take you home from the hospital or clinic. What happens during the procedure?  You will lie on an exam table with your feet and legs supported as in a pelvic exam.  To lower your risk of infection: ? Your health care team will wash or sanitize their hands and put on germ-free (sterile) gloves. ? Your genital area will be washed with soap.  An IV tube will be inserted into one of your veins.  You will be given a medicine to help you relax (sedative).  A surgical instrument with a light and camera (resectoscope) will be inserted into your vagina and moved into your uterus. This allows your surgeon to see inside your uterus.  Endometrial tissue will be removed using one of the following methods: ? Radiofrequency. This method uses a radiofrequency-alternating electric current to remove the endometrium. ? Cryotherapy. This method uses extreme cold to freeze the endometrium. ?  Heated-free liquid. This method uses a heated saltwater (saline) solution to remove the endometrium. ? Microwave. This method uses high-energy microwaves to heat up the endometrium and remove it. ? Thermal balloon. This method involves inserting a catheter with a balloon tip into the uterus. The balloon tip is filled with heated fluid to remove the endometrium. The procedure may vary among health care providers and hospitals. What happens after the procedure?  Your blood pressure, heart rate, breathing rate, and blood oxygen level will be monitored until the medicines you were given have worn off.  As tissue healing occurs, you may notice vaginal bleeding for 4-6 weeks after the procedure. You may also experience: ? Cramps. ? Thin,  watery vaginal discharge that is light pink or brown in color. ? A need to urinate more frequently than usual. ? Nausea.  Do not drive for 24 hours if you were given a sedative.  Do not have sex or insert anything into your vagina until your health care provider approves. Summary  Endometrial ablation is done to treat the many causes of heavy menstrual bleeding.  The procedure may be done only after medications have been tried to control the bleeding.  Plan to have someone take you home from the hospital or clinic. This information is not intended to replace advice given to you by your health care provider. Make sure you discuss any questions you have with your health care provider. Document Released: 10/09/2004 Document Revised: 12/17/2016 Document Reviewed: 12/17/2016 Elsevier Interactive Patient Education  2017 [employer].

## 2018-06-08 ENCOUNTER — Encounter (HOSPITAL_COMMUNITY): Payer: Self-pay | Admitting: Obstetrics and Gynecology

## 2018-06-08 ENCOUNTER — Encounter: Payer: Medicaid Other | Admitting: Obstetrics and Gynecology

## 2018-06-20 ENCOUNTER — Ambulatory Visit (INDEPENDENT_AMBULATORY_CARE_PROVIDER_SITE_OTHER): Payer: Medicaid Other | Admitting: Obstetrics and Gynecology

## 2018-06-20 ENCOUNTER — Encounter: Payer: Self-pay | Admitting: Obstetrics and Gynecology

## 2018-06-20 VITALS — BP 127/81 | HR 100 | Ht 62.0 in | Wt 167.4 lb

## 2018-06-20 DIAGNOSIS — Z9889 Other specified postprocedural states: Secondary | ICD-10-CM | POA: Diagnosis not present

## 2018-06-20 DIAGNOSIS — R202 Paresthesia of skin: Secondary | ICD-10-CM | POA: Diagnosis not present

## 2018-06-20 DIAGNOSIS — Z09 Encounter for follow-up examination after completed treatment for conditions other than malignant neoplasm: Secondary | ICD-10-CM

## 2018-06-20 NOTE — Progress Notes (Signed)
Patient ID: Mandy Garcia, female   DOB: 1986/07/01, 32 y.o.   MRN: [id number]     Subjective:  Mandy Garcia is a 32 y.o. female now 2 weeks status post [place].   Pt is having numbness in the top of both legs. Pt describes numbness, similar to when she was pregnant and felt pressure from when baby was pressing on a nerve in her legs. The numbness affects her walking and she notices swelling in feet. She sits down and rests. Pt was given toradol and vicodin after surgery and used some of the vicodin after the toradol but threw the rest of vicodin away. Pt is still having watery pink discharge. No fever  Review of Systems Negative except little pain near the bladder and cervix but not much on physical exam.   Diet:   normal   Bowel movements : normal.  The patient is not having any pain.  Objective:  BP 127/81 (BP Location: Right Arm, Patient Position: Sitting, Cuff Size: Normal)   Pulse 100   Ht 5\' 2"  (1.575 m)   Wt 167 lb 6.4 oz (75.9 kg)   BMI 30.62 kg/m  General:Well developed, well nourished.  No acute distress. Abdomen: Bowel sounds normal, soft, non-tender. Pelvic Exam:    External Genitalia:  Normal.    Vagina: Normal, minimal light pink discharge    Cervix: Normal, nonpurulent    Uterus: Normal    Adnexa/Bimanual: Normal Pex: legs. Strength : normal walking gait,       Sensory: light tough present over all surfaces of leg, no absent sensation, just a small area of anterior thigh that pt describes the sensation as :"lighter"   Assessment:  Post-Op 2 weeks s/p DILATATION & CURETTAGE/HYSTEROSCOPY WITH MINERVA ABLATION  Bilateral thigh paresthesias, minimal sensory changes noted. Doing well postoperatively.   Plan:  1. Current medications. Will not need Depo shot next week, or in future 2. Activity restrictions: none 3. return to work: not applicable. 4. Follow up in 1 year. 5. Pt to return if parsethesias do  not completely resolve in a month.  By signing my name below, I, [name], attest that this documentation has been prepared under the direction and in the presence of Jonnie Kind, MD. Electronically Signed: [place]. 06/20/18. 9:22 AM.  I personally performed the services described in this documentation, which was SCRIBED in my presence. The recorded information has been reviewed and considered accurate. It has been edited as necessary during review. Jonnie Kind, MD

## 2018-06-23 ENCOUNTER — Ambulatory Visit: Payer: Medicaid Other

## 2018-07-18 ENCOUNTER — Ambulatory Visit (INDEPENDENT_AMBULATORY_CARE_PROVIDER_SITE_OTHER): Payer: Medicaid Other | Admitting: Obstetrics and Gynecology

## 2018-07-18 ENCOUNTER — Encounter: Payer: Self-pay | Admitting: Obstetrics and Gynecology

## 2018-07-18 VITALS — BP 131/84 | HR 77 | Ht 62.0 in | Wt 170.6 lb

## 2018-07-18 DIAGNOSIS — Z9889 Other specified postprocedural states: Secondary | ICD-10-CM

## 2018-07-18 DIAGNOSIS — R202 Paresthesia of skin: Secondary | ICD-10-CM | POA: Diagnosis not present

## 2018-07-18 NOTE — Progress Notes (Signed)
Patient ID: Mandy Garcia, female   DOB: 05-17-1986, 32 y.o.   MRN: [id number]   Subjective:  Mandy Garcia is a 32 y.o. female now 5 weeks status post [place].   Follow up to 06/20/18 appointment with continuous numbing sensation in her legs since D&C/ Hysteroscopy with minerva ablation surgery done 06/07/18. She has pain in her inguinal/groin area. Previously the pain was in her anterior left thigh but  is now in inguinal area. It starts at the top of her left thigh and spreads to the back of her left leg. After an hour or 2 of doing daily activities she notices the pain and sometimes she goes to bed with pain. She puts a heating on the thigh to aliviate the pain. She denies leg weakness and has not tripped and fallen. She took " do goodies body aches" medication and ibuprofen and has no relief of pain. She no longer has watery discharge after ablation and has not resumed sexual intercourse.    Review of Systems Negative except    Diet:   Normal   Bowel movements : normal.  Having pain in inguinal/groin area with leg paresthesia  Objective:  BP 131/84 (BP Location: Right Arm, Patient Position: Sitting, Cuff Size: Normal)   Pulse 77   Ht 5\' 2"  (1.575 m)   Wt 170 lb 9.6 oz (77.4 kg)   BMI 31.20 kg/m  General:Well developed, well nourished.  No acute distress. Abdomen: Bowel sounds normal, soft, non-tender. Pelvic Exam:  PATIENT DENIED WANTED PELVIC  Incision(s): N/A    Assessment:  Post-Op 6 weeks s/p DILATATION & CURETTAGE/HYSTEROSCOPY WITH MINERVA ABLATION   1.  Chronic inguinal pain and thigh paresthesia, that predated the recent surgery   Plan:  1. F/u 2 weeks for exam with Dr. Glo Herring for ultrasound. 2 may refer to Dr Merlene Laughter    By signing my name below, I, [name], attest that this documentation has been prepared under the direction and in the presence of Jonnie Kind, MD. Electronically Signed: [place]. 07/18/18. 10:23 AM.  I personally performed the services described in this documentation, which was SCRIBED in my presence. The recorded information has been reviewed and considered accurate. It has been edited as necessary during review. Jonnie Kind, MD

## 2018-07-29 ENCOUNTER — Other Ambulatory Visit: Payer: Self-pay | Admitting: Obstetrics and Gynecology

## 2018-07-29 DIAGNOSIS — N946 Dysmenorrhea, unspecified: Secondary | ICD-10-CM

## 2018-08-01 ENCOUNTER — Encounter: Payer: Self-pay | Admitting: Obstetrics and Gynecology

## 2018-08-01 ENCOUNTER — Ambulatory Visit (INDEPENDENT_AMBULATORY_CARE_PROVIDER_SITE_OTHER): Payer: Medicaid Other | Admitting: Obstetrics and Gynecology

## 2018-08-01 ENCOUNTER — Ambulatory Visit (INDEPENDENT_AMBULATORY_CARE_PROVIDER_SITE_OTHER): Payer: Medicaid Other

## 2018-08-01 VITALS — BP 131/88 | HR 78 | Ht 62.0 in | Wt 173.2 lb

## 2018-08-01 DIAGNOSIS — G9782 Other postprocedural complications and disorders of nervous system: Secondary | ICD-10-CM

## 2018-08-01 DIAGNOSIS — R200 Anesthesia of skin: Secondary | ICD-10-CM | POA: Diagnosis not present

## 2018-08-01 DIAGNOSIS — N946 Dysmenorrhea, unspecified: Secondary | ICD-10-CM | POA: Diagnosis not present

## 2018-08-01 NOTE — Progress Notes (Signed)
PELVIC US TA/TV:homogeneous anteverted uterus,wnl,normal ovaries bilat,ovaries appear mobile,EEC 11.8 mm,no free fluid,bilat adnexal discomfort when applying probe pressure to slide ovaries.

## 2018-08-01 NOTE — Progress Notes (Signed)
Patient ID: Mandy Garcia, female   DOB: 03/02/1986, 32 y.o.   MRN: [id number]    [facility] Visit  @DATE @            Patient name: Mandy Garcia MRN [id number]  Date of birth: 12-Nov-1986  CC & HPI: f/u of thigh pain/paresthesias after ablation.also noted prior to last pregnandcy  Mandy Garcia is a 32 y.o. female presenting today for f/u of ultrasound. Hasn't had inguinal pain or leg pain in 3 days.  Had ablation 06/07/18. U/s done today showed no abnormalities and healing uterus  ROS:  ROS -fever -chills All systems are negative except as noted in the HPI and PMH.   Pertinent History Reviewed:   Reviewed: Significant for ablation Medical         Past Medical History:  Diagnosis Date  . Anemia   . Anxiety   . Cancer ([place])   . HPV in female   . Mental disorder   . Shoulder pain                               Surgical Hx:    Past Surgical History:  Procedure Laterality Date  . DILITATION & CURRETTAGE/HYSTROSCOPY WITH NOVASURE ABLATION N/A 06/07/2018   Procedure: DILATATION & CURETTAGE/HYSTEROSCOPY WITH MINERVA ABLATION;  Surgeon: Jonnie Kind, MD;  Location: [facility];  Service: Gynecology;  Laterality: N/A;  . TOE SURGERY     big toe left foot, nailbed surgery.  . TUBAL LIGATION     Medications: Reviewed & Updated - see associated section                      No current outpatient medications on file. No current facility-administered medications for this visit.   Facility-Administered Medications Ordered in Other Visits:  .  ALPRAZolam Mandy Garcia) tablet 0.5 mg, 0.5 mg, Oral, Once, Farrel Gobble, MD   Social History: Reviewed -  reports that she has been smoking cigarettes. She has a 4.00 pack-year smoking history. She has never used smokeless tobacco.  Objective Findings:  Vitals: Blood pressure 131/88, pulse 78, height 5\' 2"  (1.575 m), weight 173 lb 3.2 oz (78.6 kg).  PHYSICAL EXAMINATION General appearance - alert, well appearing, and  in no distress and oriented to person, place, and time Mental status - alert, oriented to person, place, and time, normal mood, behavior, speech, dress, motor activity, and thought processes, affect appropriate to mood PELVIC Not done  Assessment & Plan:   A:  1.  resolved neuropathy 2. No acute pain or anesthesia at present  P:  1.  3 years pap/phy, or earlier prn.    By signing my name below, I, Mandy Garcia, attest that this documentation has been prepared under the direction and in the presence of Jonnie Kind, MD. Electronically Signed: [place]. 08/01/18. 9:26 AM.  I personally performed the services described in this documentation, which was SCRIBED in my presence. The recorded information has been reviewed and considered accurate. It has been edited as necessary during review. Jonnie Kind, MD

## 2019-03-07 ENCOUNTER — Encounter: Payer: Self-pay | Admitting: Family

## 2019-03-07 ENCOUNTER — Other Ambulatory Visit: Payer: Self-pay

## 2019-03-07 ENCOUNTER — Ambulatory Visit: Payer: Medicaid Other | Admitting: Family

## 2019-03-07 VITALS — BP 127/85 | HR 93 | Temp 97.7°F | Ht 62.0 in | Wt 182.6 lb

## 2019-03-07 DIAGNOSIS — E663 Overweight: Secondary | ICD-10-CM | POA: Insufficient documentation

## 2019-03-07 DIAGNOSIS — D509 Iron deficiency anemia, unspecified: Secondary | ICD-10-CM | POA: Diagnosis not present

## 2019-03-07 DIAGNOSIS — F17200 Nicotine dependence, unspecified, uncomplicated: Secondary | ICD-10-CM

## 2019-03-07 DIAGNOSIS — Z0001 Encounter for general adult medical examination with abnormal findings: Secondary | ICD-10-CM

## 2019-03-07 DIAGNOSIS — Z23 Encounter for immunization: Secondary | ICD-10-CM | POA: Diagnosis not present

## 2019-03-07 DIAGNOSIS — E669 Obesity, unspecified: Secondary | ICD-10-CM

## 2019-03-07 DIAGNOSIS — K5900 Constipation, unspecified: Secondary | ICD-10-CM | POA: Diagnosis not present

## 2019-03-07 DIAGNOSIS — Z0000 Encounter for general adult medical examination without abnormal findings: Secondary | ICD-10-CM

## 2019-03-07 MED ORDER — LACTULOSE 10 GM/15ML PO SOLN: 20.0000 g | Freq: Every day | ORAL | 2 refills | Status: AC

## 2019-03-07 NOTE — Patient Instructions (Signed)

## 2019-03-07 NOTE — Progress Notes (Signed)
Subjective:    Patient ID: Mandy Garcia, female    DOB: [birth year], 33 y.o.   MRN: [id number]  Chief Complaint  Patient presents with  . Annual Exam    fatigue   Pt presents to the office today for CPE without pap.  Anemia  Presents for follow-up visit. Symptoms include bruises/bleeds easily, light-headedness ("some times, not all the time") and malaise/fatigue. There has been no fever.  Constipation  This is a chronic problem. The current episode started more than 1 year ago. The problem has been waxing and waning since onset. Her stool frequency is 1 time per week or less. Pertinent negatives include no fever. She has tried fiber and laxatives for the symptoms. The treatment provided mild relief.      Review of Systems  Constitutional: Positive for malaise/fatigue. Negative for fever.  Gastrointestinal: Positive for constipation.  Neurological: Positive for light-headedness ("some times, not all the time").  Hematological: Bruises/bleeds easily.  All other systems reviewed and are negative.    Family History  Problem Relation Age of Onset  . Hypertension Paternal Grandfather   . Cancer Maternal Grandmother        lung  . Diabetes Maternal Grandfather   . Hypertension Father   . Ovarian cancer Mother   . Cancer Mother        cervix  . Hypertension Mother   . Diabetes Sister   . Hypertension Sister   . Bipolar disorder Daughter   . Tourette syndrome Son   . ADD / ADHD Son     Social History   Socioeconomic History  . Marital status: Married    Spouse name: Not on file  . Number of children: Not on file  . Years of education: Not on file  . Highest education level: Not on file  Occupational History  . Not on file  Social Needs  . Financial resource strain: Not on file  . Food insecurity:    Worry: Not on file    Inability: Not on file  . Transportation needs:    Medical: Not on file    Non-medical: Not on file  Tobacco Use  . Smoking status:  Current Every Day Smoker    Packs/day: 0.25    Years: 16.00    Pack years: 4.00    Types: Cigarettes  . Smokeless tobacco: Never Used  Substance and Sexual Activity  . Alcohol use: No  . Drug use: No  . Sexual activity: Not Currently    Birth control/protection: Surgical    Comment: tubal and ablation  Lifestyle  . Physical activity:    Days per week: Not on file    Minutes per session: Not on file  . Stress: Not on file  Relationships  . Social connections:    Talks on phone: Not on file    Gets together: Not on file    Attends religious service: Not on file    Active member of club or organization: Not on file    Attends meetings of clubs or organizations: Not on file    Relationship status: Not on file  Other Topics Concern  . Not on file  Social History Narrative  . Not on file        Objective:   Physical Exam Vitals signs reviewed.  Constitutional:      General: She is not in acute distress.    Appearance: She is well-developed.  HENT:     Head: Normocephalic and atraumatic.  Right Ear: Tympanic membrane normal.     Left Ear: Tympanic membrane normal.  Eyes:     Pupils: Pupils are equal, round, and reactive to light.  Neck:     Musculoskeletal: Normal range of motion and neck supple.     Thyroid: No thyromegaly.  Cardiovascular:     Rate and Rhythm: Normal rate and regular rhythm.     Heart sounds: Normal heart sounds. No murmur.  Pulmonary:     Effort: Pulmonary effort is normal. No respiratory distress.     Breath sounds: Normal breath sounds. No wheezing.  Abdominal:     General: Bowel sounds are normal. There is no distension.     Palpations: Abdomen is soft.     Tenderness: There is no abdominal tenderness.  Musculoskeletal: Normal range of motion.        General: No tenderness.  Skin:    General: Skin is warm and dry.  Neurological:     Mental Status: She is alert and oriented to person, place, and time.     Cranial Nerves: No cranial  nerve deficit.     Deep Tendon Reflexes: Reflexes are normal and symmetric.  Psychiatric:        Behavior: Behavior normal.        Thought Content: Thought content normal.        Judgment: Judgment normal.       BP 127/85   Pulse 93   Temp 97.7 F (36.5 C) (Oral)   Ht 5' 2"  (1.575 m)   Wt 182 lb 9.6 oz (82.8 kg)   BMI 33.40 kg/m      Assessment & Plan:  Mandy Garcia comes in today with chief complaint of Annual Exam (fatigue)   Diagnosis and orders addressed:  1. Annual physical exam - CMP14+EGFR - Anemia Profile B - Lipid panel - TSH  2. Iron deficiency anemia, unspecified iron deficiency anemia type - CMP14+EGFR - Anemia Profile B  3. Constipation, unspecified constipation type - CMP14+EGFR - lactulose (CHRONULAC) 10 GM/15ML solution; Take 30 mLs (20 g total) by mouth daily for 30 days.  Dispense: 900 mL; Refill: 2  4. Smoker - CMP14+EGFR  5. Obesity (BMI 30-39.9) - CMP14+EGFR   Labs pending Health Maintenance reviewed- TDAP given today Diet and exercise encouraged  Follow up plan: 1 year   Evelina Dun, FNP

## 2019-03-07 NOTE — Addendum Note (Signed)
Addended by: Shelbie Ammons on: 03/07/2019 09:50 AM   Modules accepted: Orders

## 2019-03-08 LAB — CMP14+EGFR
ALT: 18 IU/L (ref 0–32)
AST: 18 IU/L (ref 0–40)
Albumin/Globulin Ratio: 2.2 (ref 1.2–2.2)
Albumin: 4.2 g/dL (ref 3.8–4.8)
Alkaline Phosphatase: 92 IU/L (ref 39–117)
BUN/Creatinine Ratio: 9 (ref 9–23)
BUN: 7 mg/dL (ref 6–20)
Bilirubin Total: 0.3 mg/dL (ref 0.0–1.2)
CO2: 18 mmol/L — ABNORMAL LOW (ref 20–29)
Calcium: 9.3 mg/dL (ref 8.7–10.2)
Chloride: 103 mmol/L (ref 96–106)
Creatinine, Ser: 0.77 mg/dL (ref 0.57–1.00)
GFR calc Af Amer: 116 mL/min/{1.73_m2} (ref >59)
GFR calc non Af Amer: 100 mL/min/{1.73_m2} (ref >59)
Globulin, Total: 1.9 g/dL (ref 1.5–4.5)
Glucose: 75 mg/dL (ref 65–99)
Potassium: 4.3 mmol/L (ref 3.5–5.2)
Sodium: 138 mmol/L (ref 134–144)
Total Protein: 6.1 g/dL (ref 6.0–8.5)

## 2019-03-08 LAB — LIPID PANEL
Chol/HDL Ratio: 6.3 ratio — ABNORMAL HIGH (ref 0.0–4.4)
Cholesterol, Total: 196 mg/dL (ref 100–199)
HDL: 31 mg/dL — ABNORMAL LOW (ref >39)
LDL Calculated: 133 mg/dL — ABNORMAL HIGH (ref 0–99)
Triglycerides: 161 mg/dL — ABNORMAL HIGH (ref 0–149)
VLDL Cholesterol Cal: 32 mg/dL (ref 5–40)

## 2019-03-08 LAB — ANEMIA PROFILE B
Basophils Absolute: 0.1 10*3/uL (ref 0.0–0.2)
Basos: 1 %
EOS (ABSOLUTE): 0.1 10*3/uL (ref 0.0–0.4)
Eos: 1 %
Ferritin: 21 ng/mL (ref 15–150)
Folate: 4.2 ng/mL (ref >3.0)
Hematocrit: 47.7 % — ABNORMAL HIGH (ref 34.0–46.6)
Hemoglobin: 15.8 g/dL (ref 11.1–15.9)
Immature Grans (Abs): 0 10*3/uL (ref 0.0–0.1)
Immature Granulocytes: 0 %
Iron Saturation: 26 % (ref 15–55)
Iron: 84 ug/dL (ref 27–159)
LYMPHS ABS: 2.1 10*3/uL (ref 0.7–3.1)
Lymphs: 22 %
MCH: 29.3 pg (ref 26.6–33.0)
MCHC: 33.1 g/dL (ref 31.5–35.7)
MCV: 89 fL (ref 79–97)
MONOS ABS: 0.7 10*3/uL (ref 0.1–0.9)
Monocytes: 7 %
Neutrophils Absolute: 6.7 10*3/uL (ref 1.4–7.0)
Neutrophils: 69 %
Platelets: 234 10*3/uL (ref 150–450)
RBC: 5.39 x10E6/uL — ABNORMAL HIGH (ref 3.77–5.28)
RDW: 13.3 % (ref 11.7–15.4)
Retic Ct Pct: 1.3 % (ref 0.6–2.6)
Total Iron Binding Capacity: 318 ug/dL (ref 250–450)
UIBC: 234 ug/dL (ref 131–425)
Vitamin B-12: 241 pg/mL (ref 232–1245)
WBC: 9.6 10*3/uL (ref 3.4–10.8)

## 2019-03-08 LAB — TSH: TSH: 3.1 u[IU]/mL (ref 0.450–4.500)

## 2019-08-09 ENCOUNTER — Other Ambulatory Visit: Payer: Self-pay | Admitting: Adult Health

## 2019-10-27 DIAGNOSIS — R42 Dizziness and giddiness: Secondary | ICD-10-CM | POA: Diagnosis not present

## 2019-10-27 DIAGNOSIS — G43001 Migraine without aura, not intractable, with status migrainosus: Secondary | ICD-10-CM | POA: Diagnosis not present

## 2020-01-19 ENCOUNTER — Encounter: Payer: Self-pay | Admitting: Family

## 2020-01-19 ENCOUNTER — Ambulatory Visit (INDEPENDENT_AMBULATORY_CARE_PROVIDER_SITE_OTHER): Payer: Medicaid Other | Admitting: Family

## 2020-01-19 DIAGNOSIS — K2970 Gastritis, unspecified, without bleeding: Secondary | ICD-10-CM | POA: Diagnosis not present

## 2020-01-19 MED ORDER — ONDANSETRON HCL 4 MG PO TABS: 4.0000 mg | ORAL_TABLET | Freq: Three times a day (TID) | ORAL | 0 refills | Status: DC | PRN

## 2020-01-19 NOTE — Progress Notes (Signed)
   Virtual Visit via telephone Note Due to COVID-19 pandemic this visit was conducted virtually. This visit type was conducted due to national recommendations for restrictions regarding the COVID-19 Pandemic (e.g. social distancing, sheltering in place) in an effort to limit this patient's exposure and mitigate transmission in our community. All issues noted in this document were discussed and addressed.  A physical exam was not performed with this format.  I connected with Mandy Garcia on 01/19/20 at 4:34 pm by telephone and verified that I am speaking with the correct person using two identifiers. Mandy Garcia is currently located at home and no one is currently with her during visit. The provider, Evelina Dun, FNP is located in their office at time of visit.  I discussed the limitations, risks, security and privacy concerns of performing an evaluation and management service by telephone and the availability of in person appointments. I also discussed with the patient that there may be a patient responsible charge related to this service. The patient expressed understanding and agreed to proceed.   History and Present Illness:  Emesis  This is a new problem. The current episode started today. The problem occurs 5 to 10 times per day. The problem has been unchanged. The emesis has an appearance of stomach contents. There has been no fever. Associated symptoms include abdominal pain (lower middle abdominal ). Pertinent negatives include no chest pain, chills, coughing, dizziness, fever, headaches, sweats, URI or weight loss. She has tried increased fluids (pepto) for the symptoms. The treatment provided mild relief.      Review of Systems  Constitutional: Negative for chills, fever and weight loss.  Respiratory: Negative for cough.   Cardiovascular: Negative for chest pain.  Gastrointestinal: Positive for abdominal pain (lower middle abdominal ) and vomiting.  Neurological:  Negative for dizziness and headaches.  All other systems reviewed and are negative.    Observations/Objective: No SOB or distress noted  Assessment and Plan: 1. Viral gastritis Rest Force fluids  Bland diet If a fever or abdominal pain increases call office or go to ED Work note written and sent to Mychart - ondansetron (ZOFRAN) 4 MG tablet; Take 1 tablet (4 mg total) by mouth every 8 (eight) hours as needed for nausea or vomiting.  Dispense: 20 tablet; Refill: 0   Follow Up Instructions: As needed     I discussed the assessment and treatment plan with the patient. The patient was provided an opportunity to ask questions and all were answered. The patient agreed with the plan and demonstrated an understanding of the instructions.   The patient was advised to call back or seek an in-person evaluation if the symptoms worsen or if the condition fails to improve as anticipated.  The above assessment and management plan was discussed with the patient. The patient verbalized understanding of and has agreed to the management plan. Patient is aware to call the clinic if symptoms persist or worsen. Patient is aware when to return to the clinic for a follow-up visit. Patient educated on when it is appropriate to go to the emergency department.   Time call ended:  4:44 pm   I provided 10 minutes of non-face-to-face time during this encounter.    Evelina Dun, FNP

## 2020-03-04 ENCOUNTER — Encounter: Payer: Self-pay | Admitting: Family

## 2020-03-04 ENCOUNTER — Ambulatory Visit (INDEPENDENT_AMBULATORY_CARE_PROVIDER_SITE_OTHER): Payer: Medicaid Other | Admitting: Family

## 2020-03-04 ENCOUNTER — Other Ambulatory Visit: Payer: Self-pay

## 2020-03-04 VITALS — BP 123/76 | HR 84 | Temp 98.6°F | Ht 62.0 in | Wt 169.8 lb

## 2020-03-04 DIAGNOSIS — Z01419 Encounter for gynecological examination (general) (routine) without abnormal findings: Secondary | ICD-10-CM | POA: Diagnosis not present

## 2020-03-04 DIAGNOSIS — E669 Obesity, unspecified: Secondary | ICD-10-CM

## 2020-03-04 DIAGNOSIS — D509 Iron deficiency anemia, unspecified: Secondary | ICD-10-CM

## 2020-03-04 DIAGNOSIS — Z0001 Encounter for general adult medical examination with abnormal findings: Secondary | ICD-10-CM | POA: Diagnosis not present

## 2020-03-04 DIAGNOSIS — Z0000 Encounter for general adult medical examination without abnormal findings: Secondary | ICD-10-CM

## 2020-03-04 DIAGNOSIS — F17200 Nicotine dependence, unspecified, uncomplicated: Secondary | ICD-10-CM

## 2020-03-04 DIAGNOSIS — N644 Mastodynia: Secondary | ICD-10-CM

## 2020-03-04 NOTE — Progress Notes (Signed)
Subjective:    Patient ID: Mandy Garcia, female    DOB: September 05, 1986, 34 y.o.   MRN: [id number]  Chief Complaint  Patient presents with  . Annual Exam    with pap. right left breast pain patient has bump  x one year    PT presents to the office today for CPE with pap. She is complaining of right breast pain. She reports this started in 2017 and had a diagnostic mammograms that were negative. She states the pain comes and goes, but recently has intensified. She report aching intermittent pain of 5 out 10.  Gynecologic Exam The patient's pertinent negatives include no genital itching, genital lesions, genital odor or vaginal discharge. The current episode started more than 1 year ago.  Nicotine Dependence Presents for follow-up visit. Her urge triggers include company of smokers. She smokes 1 pack of cigarettes per day.  Anemia Presents for follow-up visit. Symptoms include bruises/bleeds easily and malaise/fatigue.      Review of Systems  Constitutional: Positive for malaise/fatigue.  Genitourinary: Negative for vaginal discharge.  Hematological: Bruises/bleeds easily.  All other systems reviewed and are negative.  Family History  Problem Relation Age of Onset  . Hypertension Paternal Grandfather   . Cancer Maternal Grandmother        lung  . Diabetes Maternal Grandfather   . Hypertension Father   . Ovarian cancer Mother   . Cancer Mother        cervix  . Hypertension Mother   . Diabetes Sister   . Hypertension Sister   . Bipolar disorder Daughter   . Tourette syndrome Son   . ADD / ADHD Son    Social History   Socioeconomic History  . Marital status: Married    Spouse name: Not on file  . Number of children: Not on file  . Years of education: Not on file  . Highest education level: Not on file  Occupational History  . Not on file  Tobacco Use  . Smoking status: Current Every Day Smoker    Packs/day: 0.25    Years: 16.00    Pack years: 4.00    Types:  Cigarettes  . Smokeless tobacco: Never Used  Substance and Sexual Activity  . Alcohol use: No  . Drug use: No  . Sexual activity: Not Currently    Birth control/protection: Surgical    Comment: tubal and ablation  Other Topics Concern  . Not on file  Social History Narrative  . Not on file   Social Determinants of Health   Financial Resource Strain:   . Difficulty of Paying Living Expenses:   Food Insecurity:   . Worried About Charity fundraiser in the Last Year:   . Arboriculturist in the Last Year:   Transportation Needs:   . Film/video editor (Medical):   Marland Kitchen Lack of Transportation (Non-Medical):   Physical Activity:   . Days of Exercise per Week:   . Minutes of Exercise per Session:   Stress:   . Feeling of Stress :   Social Connections:   . Frequency of Communication with Friends and Family:   . Frequency of Social Gatherings with Friends and Family:   . Attends Religious Services:   . Active Member of Clubs or Organizations:   . Attends Archivist Meetings:   Marland Kitchen Marital Status:        Objective:   Physical Exam Vitals reviewed.  Constitutional:      General: She  is not in acute distress.    Appearance: She is well-developed.  HENT:     Head: Normocephalic and atraumatic.     Right Ear: Tympanic membrane normal.     Left Ear: Tympanic membrane normal.  Eyes:     Pupils: Pupils are equal, round, and reactive to light.  Neck:     Thyroid: No thyromegaly.  Cardiovascular:     Rate and Rhythm: Normal rate and regular rhythm.     Heart sounds: Normal heart sounds. No murmur.  Pulmonary:     Effort: Pulmonary effort is normal. No respiratory distress.     Breath sounds: Normal breath sounds. No wheezing.  Chest:     Breasts:        Right: Mass and tenderness present. No swelling, bleeding, inverted nipple, nipple discharge or skin change.        Left: No swelling, bleeding, inverted nipple, mass, nipple discharge, skin change or tenderness.    Abdominal:     General: Bowel sounds are normal. There is no distension.     Palpations: Abdomen is soft.     Tenderness: There is no abdominal tenderness.  Genitourinary:    Comments: Bimanual exam- no adnexal masses or tenderness, ovaries nonpalpable   Cervix parous and pink- No discharge  Musculoskeletal:        General: No tenderness. Normal range of motion.     Cervical back: Normal range of motion and neck supple.  Skin:    General: Skin is warm and dry.  Neurological:     Mental Status: She is alert and oriented to person, place, and time.     Cranial Nerves: No cranial nerve deficit.     Deep Tendon Reflexes: Reflexes are normal and symmetric.  Psychiatric:        Behavior: Behavior normal.        Thought Content: Thought content normal.        Judgment: Judgment normal.     BP 123/76   Pulse 84   Temp 98.6 F (37 C) (Temporal)   Ht 5' 2"  (1.575 m)   Wt 169 lb 12.8 oz (77 kg)   LMP 02/25/2020 (Exact Date)   SpO2 97%   BMI 31.06 kg/m        Assessment & Plan:  Mandy Garcia comes in today with chief complaint of Annual Exam (with pap. right left breast pain patient has bump  x one year )   Diagnosis and orders addressed:  1. Annual physical exam - CMP14+EGFR - Lipid panel - TSH - IGP, Aptima HPV, rfx 16/18,45 - MM Digital Diagnostic Bilat; Future - US BREAST COMPLETE UNI RIGHT INC AXILLA; Future - Anemia Profile B  2. Encounter for gynecological examination without abnormal finding  - CMP14+EGFR - IGP, Aptima HPV, rfx 16/18,45  3. Breast tenderness in female - CMP14+EGFR - MM Digital Diagnostic Bilat; Future - US BREAST COMPLETE UNI RIGHT INC AXILLA; Future  4. Smoker - CMP14+EGFR  5. Obesity (BMI 30-39.9) - CMP14+EGFR  6. Iron deficiency anemia, unspecified iron deficiency anemia type - CMP14+EGFR - Anemia Profile B   Labs pending Health Maintenance reviewed Diet and exercise encouraged  Follow up plan: 1 year     Evelina Dun, FNP

## 2020-03-04 NOTE — Patient Instructions (Signed)
Health Maintenance, Female Adopting a healthy lifestyle and getting preventive care are important in promoting health and wellness. Ask your health care provider about:  The right schedule for you to have regular tests and exams.  Things you can do on your own to prevent diseases and keep yourself healthy. What should I know about diet, weight, and exercise? Eat a healthy diet   Eat a diet that includes plenty of vegetables, fruits, low-fat dairy products, and lean protein.  Do not eat a lot of foods that are high in solid fats, added sugars, or sodium. Maintain a healthy weight Body mass index (BMI) is used to identify weight problems. It estimates body fat based on height and weight. Your health care provider can help determine your BMI and help you achieve or maintain a healthy weight. Get regular exercise Get regular exercise. This is one of the most important things you can do for your health. Most adults should:  Exercise for at least 150 minutes each week. The exercise should increase your heart rate and make you sweat (moderate-intensity exercise).  Do strengthening exercises at least twice a week. This is in addition to the moderate-intensity exercise.  Spend less time sitting. Even light physical activity can be beneficial. Watch cholesterol and blood lipids Have your blood tested for lipids and cholesterol at 36 years of age, then have this test every 5 years. Have your cholesterol levels checked more often if:  Your lipid or cholesterol levels are high.  You are older than 36 years of age.  You are at high risk for heart disease. What should I know about cancer screening? Depending on your health history and family history, you may need to have cancer screening at various ages. This may include screening for:  Breast cancer.  Cervical cancer.  Colorectal cancer.  Skin cancer.  Lung cancer. What should I know about heart disease, diabetes, and high blood  pressure? Blood pressure and heart disease  High blood pressure causes heart disease and increases the risk of stroke. This is more likely to develop in people who have high blood pressure readings, are of African descent, or are overweight.  Have your blood pressure checked: ? Every 3-5 years if you are 18-39 years of age. ? Every year if you are 40 years old or older. Diabetes Have regular diabetes screenings. This checks your fasting blood sugar level. Have the screening done:  Once every three years after age 40 if you are at a normal weight and have a low risk for diabetes.  More often and at a younger age if you are overweight or have a high risk for diabetes. What should I know about preventing infection? Hepatitis B If you have a higher risk for hepatitis B, you should be screened for this virus. Talk with your health care provider to find out if you are at risk for hepatitis B infection. Hepatitis C Testing is recommended for:  Everyone born from 1945 through 1965.  Anyone with known risk factors for hepatitis C. Sexually transmitted infections (STIs)  Get screened for STIs, including gonorrhea and chlamydia, if: ? You are sexually active and are younger than 36 years of age. ? You are older than 36 years of age and your health care provider tells you that you are at risk for this type of infection. ? Your sexual activity has changed since you were last screened, and you are at increased risk for chlamydia or gonorrhea. Ask your health care provider if   you are at risk.  Ask your health care provider about whether you are at high risk for HIV. Your health care provider may recommend a prescription medicine to help prevent HIV infection. If you choose to take medicine to prevent HIV, you should first get tested for HIV. You should then be tested every 3 months for as long as you are taking the medicine. Pregnancy  If you are about to stop having your period (premenopausal) and  you may become pregnant, seek counseling before you get pregnant.  Take 400 to 800 micrograms (mcg) of folic acid every day if you become pregnant.  Ask for birth control (contraception) if you want to prevent pregnancy. Osteoporosis and menopause Osteoporosis is a disease in which the bones lose minerals and strength with aging. This can result in bone fractures. If you are 65 years old or older, or if you are at risk for osteoporosis and fractures, ask your health care provider if you should:  Be screened for bone loss.  Take a calcium or vitamin D supplement to lower your risk of fractures.  Be given hormone replacement therapy (HRT) to treat symptoms of menopause. Follow these instructions at home: Lifestyle  Do not use any products that contain nicotine or tobacco, such as cigarettes, e-cigarettes, and chewing tobacco. If you need help quitting, ask your health care provider.  Do not use street drugs.  Do not share needles.  Ask your health care provider for help if you need support or information about quitting drugs. Alcohol use  Do not drink alcohol if: ? Your health care provider tells you not to drink. ? You are pregnant, may be pregnant, or are planning to become pregnant.  If you drink alcohol: ? Limit how much you use to 0-1 drink a day. ? Limit intake if you are breastfeeding.  Be aware of how much alcohol is in your drink. In the [place] one drink equals one 12 oz bottle of beer (355 mL), one 5 oz glass of wine (148 mL), or one 1 oz glass of hard liquor (44 mL). General instructions  Schedule regular health, dental, and eye exams.  Stay current with your vaccines.  Tell your health care provider if: ? You often feel depressed. ? You have ever been abused or do not feel safe at home. Summary  Adopting a healthy lifestyle and getting preventive care are important in promoting health and wellness.  Follow your health care provider's instructions about healthy  diet, exercising, and getting tested or screened for diseases.  Follow your health care provider's instructions on monitoring your cholesterol and blood pressure. This information is not intended to replace advice given to you by your health care provider. Make sure you discuss any questions you have with your health care provider. Document Revised: 11/23/2018 Document Reviewed: 11/23/2018 Elsevier Patient Education  2020 Elsevier Inc.  

## 2020-03-05 LAB — ANEMIA PROFILE B
Basophils Absolute: 0.1 10*3/uL (ref 0.0–0.2)
Basos: 1 %
EOS (ABSOLUTE): 0.1 10*3/uL (ref 0.0–0.4)
Eos: 1 %
Ferritin: 15 ng/mL (ref 15–150)
Folate: 7.3 ng/mL (ref >3.0)
Hematocrit: 46.5 % (ref 34.0–46.6)
Hemoglobin: 15.3 g/dL (ref 11.1–15.9)
Immature Grans (Abs): 0 10*3/uL (ref 0.0–0.1)
Immature Granulocytes: 0 %
Iron Saturation: 19 % (ref 15–55)
Iron: 52 ug/dL (ref 27–159)
Lymphocytes Absolute: 2.8 10*3/uL (ref 0.7–3.1)
Lymphs: 30 %
MCH: 29.7 pg (ref 26.6–33.0)
MCHC: 32.9 g/dL (ref 31.5–35.7)
MCV: 90 fL (ref 79–97)
Monocytes Absolute: 0.8 10*3/uL (ref 0.1–0.9)
Monocytes: 8 %
Neutrophils Absolute: 5.7 10*3/uL (ref 1.4–7.0)
Neutrophils: 60 %
Platelets: 228 10*3/uL (ref 150–450)
RBC: 5.15 x10E6/uL (ref 3.77–5.28)
RDW: 13.4 % (ref 11.7–15.4)
Retic Ct Pct: 1.3 % (ref 0.6–2.6)
Total Iron Binding Capacity: 274 ug/dL (ref 250–450)
UIBC: 222 ug/dL (ref 131–425)
Vitamin B-12: 228 pg/mL — ABNORMAL LOW (ref 232–1245)
WBC: 9.4 10*3/uL (ref 3.4–10.8)

## 2020-03-05 LAB — LIPID PANEL
Chol/HDL Ratio: 5.1 ratio — ABNORMAL HIGH (ref 0.0–4.4)
Cholesterol, Total: 174 mg/dL (ref 100–199)
HDL: 34 mg/dL — ABNORMAL LOW (ref >39)
LDL Chol Calc (NIH): 101 mg/dL — ABNORMAL HIGH (ref 0–99)
Triglycerides: 228 mg/dL — ABNORMAL HIGH (ref 0–149)
VLDL Cholesterol Cal: 39 mg/dL (ref 5–40)

## 2020-03-05 LAB — CMP14+EGFR
ALT: 12 IU/L (ref 0–32)
AST: 17 IU/L (ref 0–40)
Albumin/Globulin Ratio: 1.7 (ref 1.2–2.2)
Albumin: 4 g/dL (ref 3.8–4.8)
Alkaline Phosphatase: 101 IU/L (ref 39–117)
BUN/Creatinine Ratio: 14 (ref 9–23)
BUN: 10 mg/dL (ref 6–20)
Bilirubin Total: 0.2 mg/dL (ref 0.0–1.2)
CO2: 22 mmol/L (ref 20–29)
Calcium: 9.2 mg/dL (ref 8.7–10.2)
Chloride: 107 mmol/L — ABNORMAL HIGH (ref 96–106)
Creatinine, Ser: 0.71 mg/dL (ref 0.57–1.00)
GFR calc Af Amer: 127 mL/min/{1.73_m2} (ref >59)
GFR calc non Af Amer: 110 mL/min/{1.73_m2} (ref >59)
Globulin, Total: 2.3 g/dL (ref 1.5–4.5)
Glucose: 90 mg/dL (ref 65–99)
Potassium: 4.4 mmol/L (ref 3.5–5.2)
Sodium: 139 mmol/L (ref 134–144)
Total Protein: 6.3 g/dL (ref 6.0–8.5)

## 2020-03-05 LAB — TSH: TSH: 2.49 u[IU]/mL (ref 0.450–4.500)

## 2020-03-06 ENCOUNTER — Other Ambulatory Visit: Payer: Self-pay

## 2020-03-06 DIAGNOSIS — N6310 Unspecified lump in the right breast, unspecified quadrant: Secondary | ICD-10-CM

## 2020-03-06 DIAGNOSIS — N644 Mastodynia: Secondary | ICD-10-CM

## 2020-03-07 LAB — IGP, APTIMA HPV, RFX 16/18,45: HPV Aptima: NEGATIVE

## 2020-03-25 ENCOUNTER — Ambulatory Visit (INDEPENDENT_AMBULATORY_CARE_PROVIDER_SITE_OTHER): Payer: Medicaid Other | Admitting: Family Medicine

## 2020-03-25 NOTE — Progress Notes (Signed)
Attempted to call patient and no answer, left a message for her to call back and reschedule.

## 2020-03-26 ENCOUNTER — Encounter (HOSPITAL_COMMUNITY): Payer: Medicaid Other

## 2020-03-26 ENCOUNTER — Ambulatory Visit (HOSPITAL_COMMUNITY): Payer: Medicaid Other

## 2020-05-23 ENCOUNTER — Telehealth: Payer: Self-pay | Admitting: Family

## 2020-05-23 ENCOUNTER — Telehealth: Payer: Self-pay

## 2020-05-23 NOTE — Telephone Encounter (Signed)
Called patient to follow up on canceled breast imaging - LMTCB

## 2020-05-23 NOTE — Telephone Encounter (Signed)
Pt returned missed call from [facility] at [facility]. Requested that Melissa call her back anytime today after 3:30 PM. She will be off work by then.   Phone# [phone]

## 2020-05-23 NOTE — Telephone Encounter (Signed)
Called patient in regards to breast imaging.  Patient states that she has tried to call Mandy Garcia but has not been able to get appointment set up.  I called Mandy Garcia and was unable to reach anyone to set up appointment - will call back

## 2020-05-23 NOTE — Telephone Encounter (Signed)
Requesting another call.

## 2020-06-11 ENCOUNTER — Other Ambulatory Visit: Payer: Self-pay

## 2020-06-11 ENCOUNTER — Ambulatory Visit (HOSPITAL_COMMUNITY)
Admission: RE | Admit: 2020-06-11 | Discharge: 2020-06-11 | Disposition: A | Discharge status: Home / Self Care | Payer: Medicaid Other | Source: Ambulatory Visit | Attending: Family | Admitting: Family

## 2020-06-11 DIAGNOSIS — R922 Inconclusive mammogram: Secondary | ICD-10-CM | POA: Diagnosis not present

## 2020-06-11 DIAGNOSIS — N644 Mastodynia: Secondary | ICD-10-CM | POA: Insufficient documentation

## 2020-06-11 DIAGNOSIS — N6001 Solitary cyst of right breast: Secondary | ICD-10-CM | POA: Diagnosis not present

## 2020-06-11 DIAGNOSIS — N6011 Diffuse cystic mastopathy of right breast: Secondary | ICD-10-CM | POA: Diagnosis not present

## 2020-06-11 DIAGNOSIS — N6310 Unspecified lump in the right breast, unspecified quadrant: Secondary | ICD-10-CM | POA: Diagnosis not present

## 2020-06-11 DIAGNOSIS — Z0000 Encounter for general adult medical examination without abnormal findings: Secondary | ICD-10-CM | POA: Diagnosis not present

## 2020-07-10 ENCOUNTER — Encounter: Payer: Self-pay | Admitting: Family Medicine

## 2020-07-10 ENCOUNTER — Ambulatory Visit (INDEPENDENT_AMBULATORY_CARE_PROVIDER_SITE_OTHER): Payer: Medicaid Other | Admitting: Family Medicine

## 2020-07-10 DIAGNOSIS — R112 Nausea with vomiting, unspecified: Secondary | ICD-10-CM

## 2020-07-10 DIAGNOSIS — R1012 Left upper quadrant pain: Secondary | ICD-10-CM | POA: Diagnosis not present

## 2020-07-10 MED ORDER — ONDANSETRON 4 MG PO TBDP: 4.0000 mg | ORAL_TABLET | Freq: Three times a day (TID) | ORAL | 0 refills | Status: DC | PRN

## 2020-07-10 NOTE — Progress Notes (Signed)
   Virtual Visit via Telephone Note  I connected with Mandy Garcia on 07/10/20 at 5:11 PM by telephone and verified that I am speaking with the correct person using two identifiers. Mandy Garcia is currently located at home and her husband and children are currently with her during this visit. The provider, Loman Brooklyn, FNP is located in their office at time of visit.  I discussed the limitations, risks, security and privacy concerns of performing an evaluation and management service by telephone and the availability of in person appointments. I also discussed with the patient that there may be a patient responsible charge related to this service. The patient expressed understanding and agreed to proceed.  Subjective: PCP: Sharion Balloon, FNP  Chief Complaint  Patient presents with  . Nausea  . Emesis   Patient reports she started experiencing left upper abdominal pain yesterday at work.  She has had one episode of nausea and vomiting today.  Patient reports eating makes her symptoms worse.  The only food she named off that she has a recently are [place] fries and bread.  She reports she is having regular bowel movements.  She does not drink alcohol.  Denies any fever or being in contact with others with similar symptoms.   ROS: Per HPI  Current Outpatient Medications:  .  Ferrous Sulfate (IRON PO), Take by mouth., Disp: , Rfl:   Allergies  Allergen Reactions  . Doxycycline     Unknown reaction   . Tramadol     "hallucinations"   Past Medical History:  Diagnosis Date  . Anemia   . Anxiety   . Cancer ([place])   . HPV in female   . Mental disorder   . Shoulder pain     Observations/Objective: A&O  No respiratory distress or wheezing audible over the phone Mood, judgement, and thought processes all WNL  Assessment and Plan: 1. Intractable vomiting with nausea, unspecified vomiting type - Discussed with her symptoms just starting it is hard to say what  this is exactly.  Discussed gastroenteritis and atypical course of symptoms.  Advised patient to call back if she develops other symptoms and that is not the way this goes. - ondansetron (ZOFRAN ODT) 4 MG disintegrating tablet; Take 1 tablet (4 mg total) by mouth every 8 (eight) hours as needed for nausea or vomiting.  Dispense: 20 tablet; Refill: 0  2. LUQ abdominal pain - Advised patient to eat bland foods and go to the ER if her pain is unbearable.   Follow Up Instructions:  I discussed the assessment and treatment plan with the patient. The patient was provided an opportunity to ask questions and all were answered. The patient agreed with the plan and demonstrated an understanding of the instructions.   The patient was advised to call back or seek an in-person evaluation if the symptoms worsen or if the condition fails to improve as anticipated.  The above assessment and management plan was discussed with the patient. The patient verbalized understanding of and has agreed to the management plan. Patient is aware to call the clinic if symptoms persist or worsen. Patient is aware when to return to the clinic for a follow-up visit. Patient educated on when it is appropriate to go to the emergency department.   Time call ended: 5:21 PM  I provided 13 minutes of non-face-to-face time during this encounter.  Hendricks Limes, MSN, APRN, FNP-C [facility] 07/10/20

## 2020-07-12 DIAGNOSIS — R0781 Pleurodynia: Secondary | ICD-10-CM | POA: Diagnosis not present

## 2020-07-12 DIAGNOSIS — M79642 Pain in left hand: Secondary | ICD-10-CM | POA: Diagnosis not present

## 2020-07-12 DIAGNOSIS — M79645 Pain in left finger(s): Secondary | ICD-10-CM | POA: Diagnosis not present

## 2020-07-25 ENCOUNTER — Other Ambulatory Visit: Payer: Medicaid Other

## 2020-12-14 DIAGNOSIS — Z419 Encounter for procedure for purposes other than remedying health state, unspecified: Secondary | ICD-10-CM | POA: Diagnosis not present

## 2021-01-14 DIAGNOSIS — Z419 Encounter for procedure for purposes other than remedying health state, unspecified: Secondary | ICD-10-CM | POA: Diagnosis not present

## 2021-02-11 DIAGNOSIS — Z419 Encounter for procedure for purposes other than remedying health state, unspecified: Secondary | ICD-10-CM | POA: Diagnosis not present

## 2021-03-14 DIAGNOSIS — Z419 Encounter for procedure for purposes other than remedying health state, unspecified: Secondary | ICD-10-CM | POA: Diagnosis not present

## 2021-04-13 DIAGNOSIS — Z419 Encounter for procedure for purposes other than remedying health state, unspecified: Secondary | ICD-10-CM | POA: Diagnosis not present

## 2021-04-18 DIAGNOSIS — R509 Fever, unspecified: Secondary | ICD-10-CM | POA: Diagnosis not present

## 2021-04-18 DIAGNOSIS — J029 Acute pharyngitis, unspecified: Secondary | ICD-10-CM | POA: Diagnosis not present

## 2021-04-18 DIAGNOSIS — R059 Cough, unspecified: Secondary | ICD-10-CM | POA: Diagnosis not present

## 2021-04-18 DIAGNOSIS — J0100 Acute maxillary sinusitis, unspecified: Secondary | ICD-10-CM | POA: Diagnosis not present

## 2021-05-14 DIAGNOSIS — Z419 Encounter for procedure for purposes other than remedying health state, unspecified: Secondary | ICD-10-CM | POA: Diagnosis not present

## 2021-06-13 DIAGNOSIS — Z419 Encounter for procedure for purposes other than remedying health state, unspecified: Secondary | ICD-10-CM | POA: Diagnosis not present

## 2021-07-14 DIAGNOSIS — Z419 Encounter for procedure for purposes other than remedying health state, unspecified: Secondary | ICD-10-CM | POA: Diagnosis not present

## 2021-07-29 DIAGNOSIS — J029 Acute pharyngitis, unspecified: Secondary | ICD-10-CM | POA: Diagnosis not present

## 2021-07-29 DIAGNOSIS — R509 Fever, unspecified: Secondary | ICD-10-CM | POA: Diagnosis not present

## 2021-08-06 ENCOUNTER — Encounter (HOSPITAL_COMMUNITY): Payer: Self-pay | Admitting: Hematology and Oncology

## 2021-08-06 ENCOUNTER — Telehealth (INDEPENDENT_AMBULATORY_CARE_PROVIDER_SITE_OTHER): Payer: Medicaid Other | Admitting: Family Medicine

## 2021-08-06 ENCOUNTER — Encounter: Payer: Self-pay | Admitting: Family Medicine

## 2021-08-06 DIAGNOSIS — J0110 Acute frontal sinusitis, unspecified: Secondary | ICD-10-CM

## 2021-08-06 MED ORDER — AMOXICILLIN-POT CLAVULANATE 875-125 MG PO TABS: 1.0000 | ORAL_TABLET | Freq: Two times a day (BID) | ORAL | 0 refills | Status: AC

## 2021-08-06 NOTE — Progress Notes (Signed)
   Virtual Visit via video Note   Due to COVID-19 pandemic this visit was conducted virtually. This visit type was conducted due to national recommendations for restrictions regarding the COVID-19 Pandemic (e.g. social distancing, sheltering in place) in an effort to limit this patient's exposure and mitigate transmission in our community. All issues noted in this document were discussed and addressed.  A physical exam was not performed with this format.  I connected with  Mandy Garcia  on 08/06/21 at 1434 by video and verified that I am speaking with the correct person using two identifiers. Mandy Garcia is currently located at home and no one is currently with her during the visit. The provider, Gwenlyn Perking, FNP is located in their office at time of visit.  I discussed the limitations, risks, security and privacy concerns of performing an evaluation and management service by video  and the availability of in person appointments. I also discussed with the patient that there may be a patient responsible charge related to this service. The patient expressed understanding and agreed to proceed.  CC: sinusitis  History and Present Illness:  Mandy Garcia reports sinus pressure and headache for 7 days. The pain is around her nose and above her eye brows. She also tested positive for Covid on 8/16 and reports that her symptoms stared on 8/15. She reports that her Covid symptoms resolved and then the sinus pressure worsened. She also reports nasal congestion. She denies fever, body aches, chills, cough, chest pain, shortness of breath, or wheezing. She has taken clartin and zyrtec with little improvement.    ROS As per HPI.    Observations/Objective: Alert and oriented. Respirations appear unlabored, no distress. Skin pink and well perfused. No cyanosis or angioedema.   Assessment and Plan: Mandy Garcia was seen today for sinusitis.  Diagnoses and all orders for this  visit:  Acute non-recurrent frontal sinusitis Augmentin ordered as below. Continue antihistamine. Discussed symptomatic care and return precautions.  -     amoxicillin-clavulanate (AUGMENTIN) 875-125 MG tablet; Take 1 tablet by mouth 2 (two) times daily for 7 days.   Follow Up Instructions: Return to office for new or worsening symptoms, or if symptoms persist.     I discussed the assessment and treatment plan with the patient. The patient was provided an opportunity to ask questions and all were answered. The patient agreed with the plan and demonstrated an understanding of the instructions.   The patient was advised to call back or seek an in-person evaluation if the symptoms worsen or if the condition fails to improve as anticipated.  The above assessment and management plan was discussed with the patient. The patient verbalized understanding of and has agreed to the management plan. Patient is aware to call the clinic if symptoms persist or worsen. Patient is aware when to return to the clinic for a follow-up visit. Patient educated on when it is appropriate to go to the emergency department.   Time call ended: 1442  I provided 8 minutes of face-to-face time during this encounter.    Gwenlyn Perking, FNP

## 2021-08-14 DIAGNOSIS — Z419 Encounter for procedure for purposes other than remedying health state, unspecified: Secondary | ICD-10-CM | POA: Diagnosis not present

## 2021-09-13 DIAGNOSIS — Z419 Encounter for procedure for purposes other than remedying health state, unspecified: Secondary | ICD-10-CM | POA: Diagnosis not present

## 2021-10-14 DIAGNOSIS — Z419 Encounter for procedure for purposes other than remedying health state, unspecified: Secondary | ICD-10-CM | POA: Diagnosis not present

## 2021-11-05 IMAGING — MG DIGITAL DIAGNOSTIC BILAT W/ TOMO W/ CAD
6 of 10 series · 6 of 30 positions shown · non-contrast
Comparison: 09/29/2016

CLINICAL DATA: Patient presents with focal pain and tenderness the
lateral right breast.

EXAM:
DIGITAL DIAGNOSTIC BILATERAL MAMMOGRAM WITH CAD AND TOMO
ULTRASOUND RIGHT BREAST

[L MLO synth-2D]
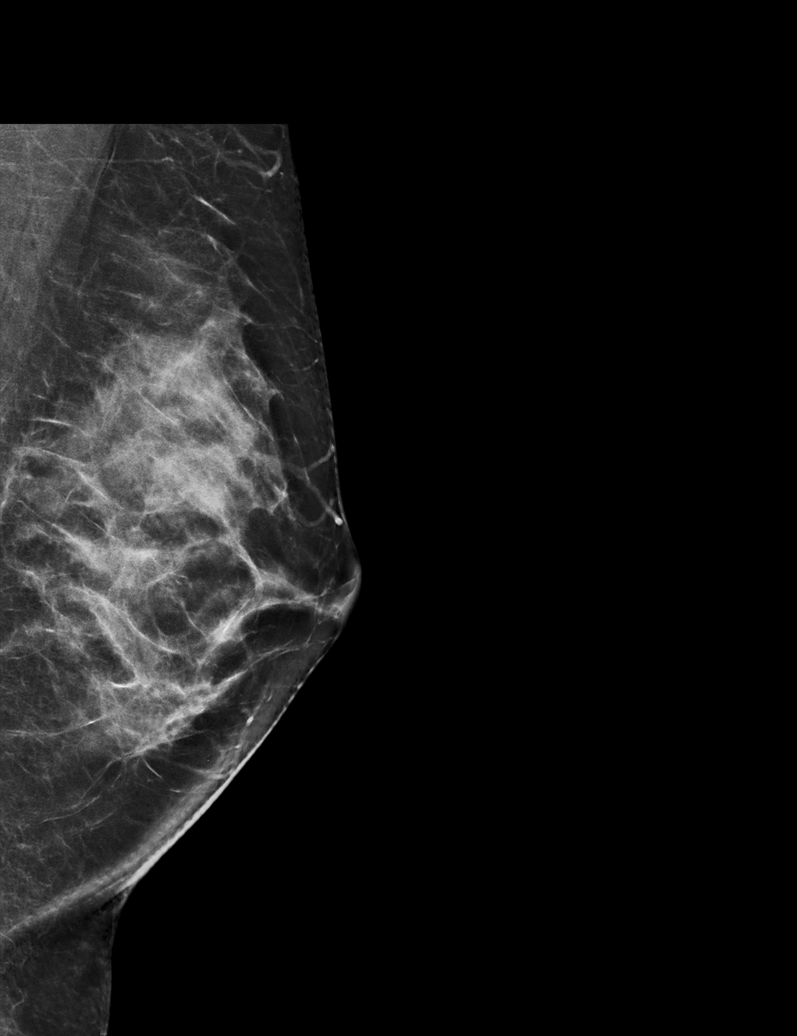

[L CC synth-2D]
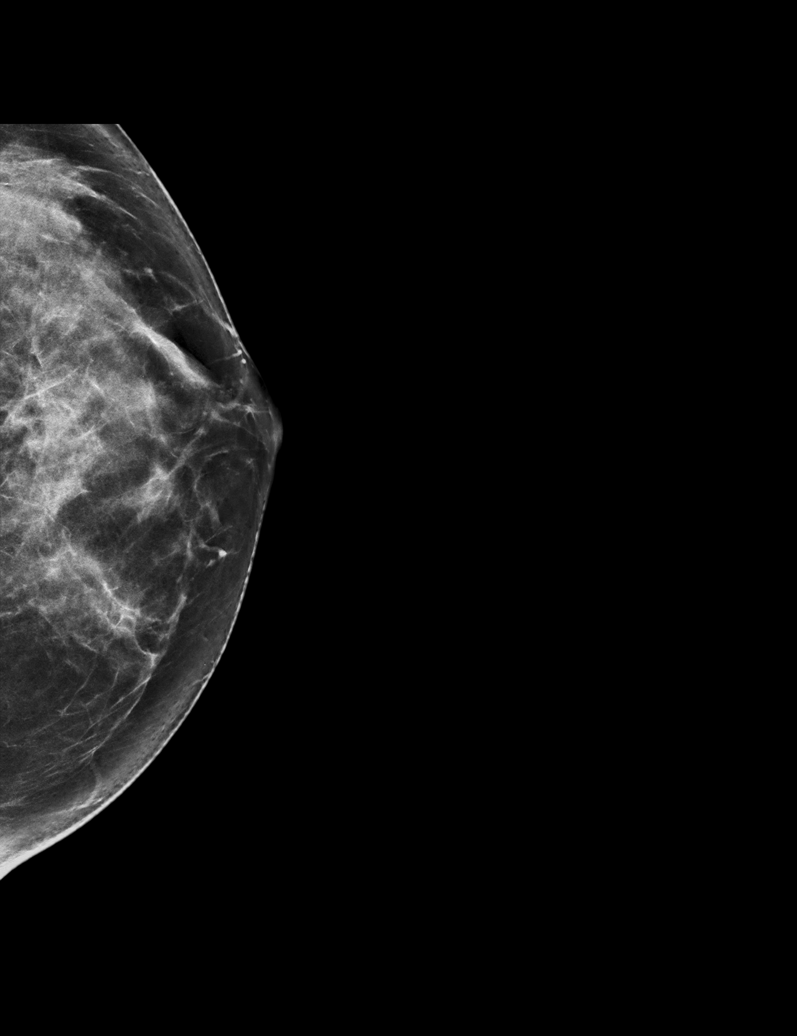

[R CC synth-2D]
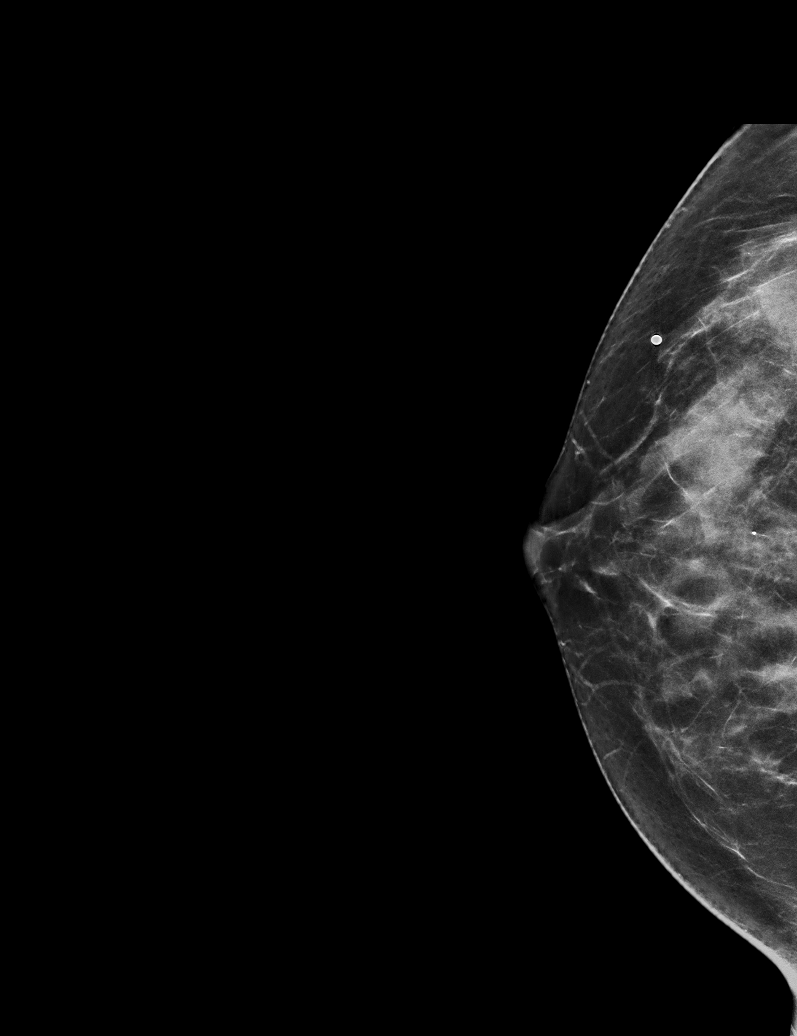

[R XCCL synth-2D]
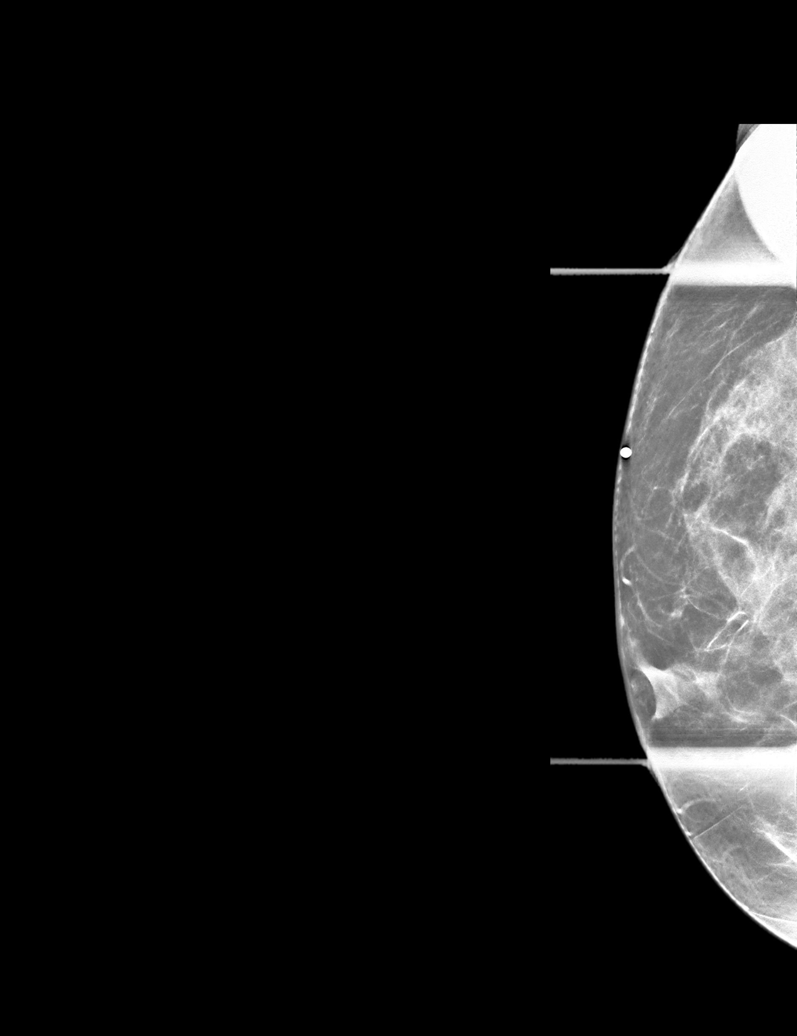

[R MLO synth-2D]
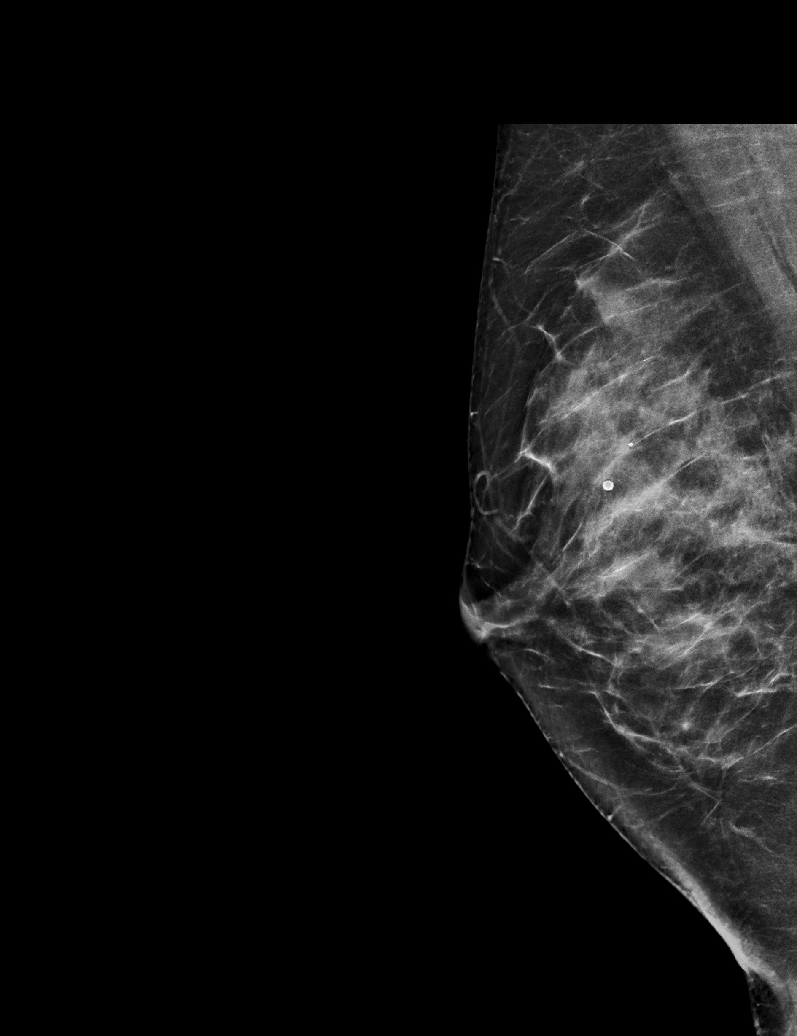

[R MLO tomo · tomo slice 33/66.0]
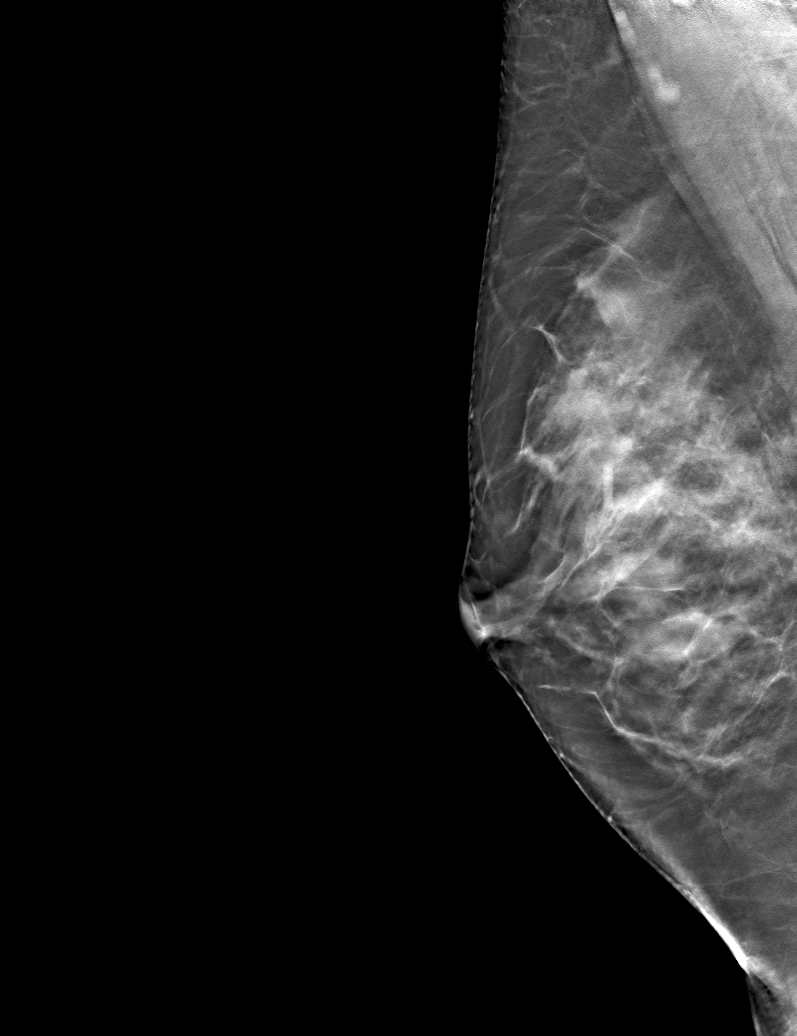

[6 of 30 positions shown; findings below may reference images not displayed]

ACR Breast Density Category c: The breast tissue is heterogeneously
dense, which may obscure small masses.
FINDINGS: There are no masses, areas of architectural distortion, areas of
significant asymmetry or suspicious calcifications. No mammographic
change.

Mammographic images were processed with CAD.

On physical exam, patient is significantly tender to palpation the
lateral right breast from approximately 10 o'clock to 8 o'clock. No
masses.

Targeted ultrasound is performed, showing several cysts in the
lateral breast from 10:30 o'clock to approximately 8 o'clock,
largest at 10:30 o'clock, 6 cm the nipple, measuring 1.2 x 0.7 x
cm. There are no solid masses or suspicious lesions.
IMPRESSION: 1. No evidence of breast malignancy.
2. Benign cysts in the lateral right breast. Patient's breast
pain/tenderness may be due to fibrocystic change.

RECOMMENDATION:
Screening mammogram at age 40 unless there are persistent or
intervening clinical concerns. (Code:A8-E-2BT)

I have discussed the findings and recommendations with the patient.
If applicable, a reminder letter will be sent to the patient
regarding the next appointment.

BI-RADS CATEGORY  2: Benign.

## 2021-11-13 DIAGNOSIS — Z419 Encounter for procedure for purposes other than remedying health state, unspecified: Secondary | ICD-10-CM | POA: Diagnosis not present

## 2021-12-26 DIAGNOSIS — H6501 Acute serous otitis media, right ear: Secondary | ICD-10-CM | POA: Diagnosis not present

## 2021-12-26 DIAGNOSIS — H6980 Other specified disorders of Eustachian tube, unspecified ear: Secondary | ICD-10-CM | POA: Diagnosis not present

## 2021-12-26 DIAGNOSIS — H60319 Diffuse otitis externa, unspecified ear: Secondary | ICD-10-CM | POA: Diagnosis not present

## 2022-02-13 ENCOUNTER — Ambulatory Visit: Payer: Medicaid Other | Admitting: Family

## 2022-02-13 ENCOUNTER — Encounter: Payer: Self-pay | Admitting: Family

## 2022-02-13 VITALS — BP 144/88 | HR 70 | Temp 97.0°F | Ht 64.0 in | Wt 195.0 lb

## 2022-02-13 DIAGNOSIS — Z0000 Encounter for general adult medical examination without abnormal findings: Secondary | ICD-10-CM

## 2022-02-13 DIAGNOSIS — F17200 Nicotine dependence, unspecified, uncomplicated: Secondary | ICD-10-CM | POA: Diagnosis not present

## 2022-02-13 DIAGNOSIS — N920 Excessive and frequent menstruation with regular cycle: Secondary | ICD-10-CM | POA: Diagnosis not present

## 2022-02-13 DIAGNOSIS — E669 Obesity, unspecified: Secondary | ICD-10-CM

## 2022-02-13 DIAGNOSIS — D509 Iron deficiency anemia, unspecified: Secondary | ICD-10-CM

## 2022-02-13 DIAGNOSIS — K5900 Constipation, unspecified: Secondary | ICD-10-CM | POA: Diagnosis not present

## 2022-02-13 DIAGNOSIS — Z0001 Encounter for general adult medical examination with abnormal findings: Secondary | ICD-10-CM | POA: Diagnosis not present

## 2022-02-13 MED ORDER — LINACLOTIDE 145 MCG PO CAPS: 145.0000 ug | ORAL_CAPSULE | Freq: Every day | ORAL | 1 refills | Status: DC

## 2022-02-13 NOTE — Patient Instructions (Signed)
Menorrhagia ?Menorrhagia is a form of abnormal uterine bleeding in which menstrual periods are heavy or last longer than normal. With menorrhagia, the periods may cause enough blood loss and cramping that a woman becomes unable to take part in her usual activities. ?What are the causes? ?Common causes of this condition include: ?Polyps or fibroids. These are noncancerous growths in the uterus. ?An imbalance of the hormones estrogen and progesterone. ?Anovulation, which occurs when one of the ovaries does not release an egg during one or more months. ?A problem with the thyroid gland (hypothyroidism). ?Side effects of having an intrauterine device (IUD). ?Side effects of some medicines, such as NSAIDs or blood thinners. ?A bleeding disorder that stops the blood from clotting normally. ?In some cases, the cause of this condition is not known. ?What increases the risk? ?You are more likely to develop this condition if you have cancer of the uterus. ?What are the signs or symptoms? ?Symptoms of this condition include: ?Routinely having to change your pad or tampon every 1-2 hours because it is soaked. ?Needing to use pads and tampons at the same time because of heavy bleeding. ?Needing to wake up to change your pads or tampons during the night. ?Passing blood clots larger than 1 inch (2.5 cm) in size. ?Having bleeding that lasts for more than 7 days. ?Having symptoms of low iron levels (anemia), such as tiredness (fatigue) or shortness of breath. ?How is this diagnosed? ?This condition may be diagnosed based on: ?A physical exam. ?Your symptoms and menstrual history. ?Tests, such as: ?Blood tests to check if you are pregnant or if you have hormonal changes, a bleeding or thyroid disorder, anemia, or other problems. ?Pap test to check for cancerous changes, infections, or inflammation. ?Endometrial biopsy. This test involves removing a tissue sample from the lining of the uterus (endometrium) to be examined under a  microscope. ?Pelvic ultrasound. This test uses sound waves to create images of your uterus, ovaries, and vagina. The images can show if you have fibroids or other growths. ?Hysteroscopy. For this test, a thin, flexible tube with a light on the end (hysteroscope) is used to look inside your uterus. ?How is this treated? ?Treatment may not be needed for this condition. If it is needed, the best treatment for you will depend on: ?Whether you need to prevent pregnancy. ?Your desire to have children in the future. ?The cause and severity of your bleeding. ?Your personal preference. ?Medicine ?Medicines are the first step in treatment. You may be treated with: ?Hormonal birth control methods. These treatments reduce bleeding during your menstrual period. They include: ?Birth control pills. ?Skin patch. ?Vaginal ring. ?Shots (injections) that you get every 3 months. ?Hormonal IUD. ?Implants that go under the skin. ?Medicines that thicken the blood and slow bleeding. ?Medicines that reduce swelling, such as ibuprofen. ?Medicines that contain an artificial (synthetic) hormone called progestin. ?Medicines that make the ovaries stop working for a short time. ?Iron supplements to treat anemia. ? ?Surgery ?If medicines do not work, surgery may be done. Surgical options may include: ?Dilation and curettage (D&C). In this procedure, your health care provider opens the lowest part of the uterus (cervix) and then scrapes or suctions tissue from the endometrium. This reduces menstrual bleeding. ?Operative hysteroscopy. In this procedure, a hysteroscope is used to view your uterus and help remove polyps that may be causing heavy periods. ?Endometrial ablation. This is when various techniques are used to permanently destroy your entire endometrium. After endometrial ablation, most women have little  or no menstrual flow. This procedure reduces your ability to become pregnant. ?Endometrial resection. In this procedure, an electrosurgical  wire loop is used to remove the endometrium. This procedure reduces your ability to become pregnant. ?Hysterectomy. This is surgical removal of your uterus. This is a permanent procedure that stops menstrual periods. Pregnancy is not possible after a hysterectomy. ?Follow these instructions at home: ?Medicines ?Take over-the-counter and prescription medicines only as told by your health care provider. This includes iron pills. ?Do not change or switch medicines without asking your health care provider. ?Do not take aspirin or medicines that contain aspirin 1 week before or during your menstrual period. Aspirin may make bleeding worse. ?Managing constipation ?Your iron pills may cause constipation. If you are taking prescription iron supplements, you may need to take these actions to prevent or treat constipation: ?Drink enough fluid to keep your urine pale yellow. ?Take over-the-counter or prescription medicines. ?Eat foods that are high in fiber, such as beans, whole grains, and fresh fruits and vegetables. ?Limit foods that are high in fat and processed sugars, such as fried or sweet foods. ?General instructions ?If you need to change your sanitary pad or tampon more than once every 2 hours, limit your activity until the bleeding stops. ?Eat well-balanced meals, including foods that are high in iron. Foods that have a lot of iron include leafy green vegetables, meat, liver, eggs, and whole-grain breads and cereals. ?Do not try to lose weight until the abnormal bleeding has stopped and your blood iron level is back to normal. If you need to lose weight, work with your health care provider to lose weight safely. ?Keep all follow-up visits. This is important. ?Contact a health care provider if: ?You soak through a pad or tampon every 1 or 2 hours, and this happens every time you have a period. ?You need to use pads and tampons at the same time because you are bleeding so much. ?You have nausea, vomiting, diarrhea, or  other problems related to medicines you are taking. ?Get help right away if: ?You soak through more than a pad or tampon in 1 hour. ?You pass clots bigger than 1 inch (2.5 cm) wide. ?You feel short of breath. ?You feel like your heart is beating too fast. ?You feel dizzy or you faint. ?You feel very weak or tired. ?Summary ?Menorrhagia is a form of abnormal uterine bleeding in which menstrual periods are heavy or last longer than normal. ?Treatment may not be needed for this condition. If it is needed, it may include medicines or procedures. ?Take over-the-counter and prescription medicines only as told by your health care provider. This includes iron pills. ?Get help right away if you have heavy bleeding that soaks through more than a pad or tampon in 1 hour, you pass large clots, or you feel dizzy, short of breath, or very weak or tired. ?This information is not intended to replace advice given to you by your health care provider. Make sure you discuss any questions you have with your health care provider. ?Document Revised: 08/13/2020 Document Reviewed: 08/13/2020 ?Elsevier Patient Education ? [place]. ? ?

## 2022-02-13 NOTE — Progress Notes (Signed)
? ?Subjective:  ? ? Patient ID: Mandy Garcia, female    DOB: July 24, 1986, 36 y.o.   MRN: [id number] ? ?Chief Complaint  ?Patient presents with  ? Annual Exam  ?  Just started having periods back 3 mths ago has not had this month yet. Vagina swelling when she has her period   ? ?Pt presents to the office today for CPE without pap. She reports she had an ablation approx 4-5 years ago for dysmenorrhea and menorrhagia. That improved the dysmenorrhea, however, over the last three months she has had heavy periods that last 8- 10 days. She reports she will go through 5-6 pads a day. Denies any heavy cramping, but states her vagina will swell while she was on her menstrual cycle. This resolved, once she completed her menstrual cycle.  ?Anemia ?Presents for follow-up visit. Symptoms include malaise/fatigue. There has been no confusion or leg swelling.  ?Nicotine Dependence ?Presents for follow-up visit. Her urge triggers include company of smokers. Number of cigarettes per day: vaping, states one pen lasts her two weeks.  ?Constipation ?This is a chronic problem. The current episode started more than 1 year ago. Her stool frequency is 1 time per week or less. She has tried laxatives, mineral oil, stool softeners and diet changes for the symptoms. The treatment provided no relief.  ? ? ? ?Review of Systems  ?Constitutional:  Positive for malaise/fatigue.  ?Gastrointestinal:  Positive for constipation.  ?Psychiatric/Behavioral:  Negative for confusion.   ?All other systems reviewed and are negative. ? ?   ?Family History  ?Problem Relation Age of Onset  ? Hypertension Paternal Grandfather   ? Cancer Maternal Grandmother   ?     lung  ? Diabetes Maternal Grandfather   ? Hypertension Father   ? Ovarian cancer Mother   ? Cancer Mother   ?     cervix  ? Hypertension Mother   ? Diabetes Sister   ? Hypertension Sister   ? Bipolar disorder Daughter   ? Tourette syndrome Son   ? ADD / ADHD Son   ? ?Social History   ? ?Socioeconomic History  ? Marital status: Married  ?  Spouse name: Not on file  ? Number of children: Not on file  ? Years of education: Not on file  ? Highest education level: Not on file  ?Occupational History  ? Not on file  ?Tobacco Use  ? Smoking status: Every Day  ?  Packs/day: 0.25  ?  Years: 16.00  ?  Pack years: 4.00  ?  Types: Cigarettes  ? Smokeless tobacco: Never  ?Vaping Use  ? Vaping Use: Never used  ?Substance and Sexual Activity  ? Alcohol use: No  ? Drug use: No  ? Sexual activity: Not Currently  ?  Birth control/protection: Surgical  ?  Comment: tubal and ablation  ?Other Topics Concern  ? Not on file  ?Social History Narrative  ? Not on file  ? ?Social Determinants of Health  ? ?Financial Resource Strain: Not on file  ?Food Insecurity: Not on file  ?Transportation Needs: Not on file  ?Physical Activity: Not on file  ?Stress: Not on file  ?Social Connections: Not on file  ? ? ?Objective:  ? Physical Exam ?Vitals reviewed.  ?Constitutional:   ?   General: She is not in acute distress. ?   Appearance: She is well-developed. She is obese.  ?HENT:  ?   Head: Normocephalic and atraumatic.  ?   Right Ear: Tympanic membrane  normal. There is no impacted cerumen.  ?   Left Ear: Tympanic membrane normal.  ?Eyes:  ?   Pupils: Pupils are equal, round, and reactive to light.  ?Neck:  ?   Thyroid: No thyromegaly.  ?Cardiovascular:  ?   Rate and Rhythm: Normal rate and regular rhythm.  ?   Heart sounds: Normal heart sounds. No murmur heard. ?Pulmonary:  ?   Effort: Pulmonary effort is normal. No respiratory distress.  ?   Breath sounds: Normal breath sounds. No wheezing.  ?Abdominal:  ?   General: Bowel sounds are normal. There is no distension.  ?   Palpations: Abdomen is soft.  ?   Tenderness: There is no abdominal tenderness.  ?Musculoskeletal:     ?   General: No tenderness. Normal range of motion.  ?   Cervical back: Normal range of motion and neck supple.  ?Skin: ?   General: Skin is warm and dry.   ?Neurological:  ?   Mental Status: She is alert and oriented to person, place, and time.  ?   Cranial Nerves: No cranial nerve deficit.  ?   Deep Tendon Reflexes: Reflexes are normal and symmetric.  ?Psychiatric:     ?   Behavior: Behavior normal.     ?   Thought Content: Thought content normal.     ?   Judgment: Judgment normal.  ? ? ? ? ?BP (!) 144/88   Pulse 70   Temp (!) 97 ?F (36.1 ?C) (Temporal)   Ht 5' 4" (1.626 m)   Wt 195 lb (88.5 kg)   BMI 33.47 kg/m?  ? ?   ?Assessment & Plan:  ?Mandy Garcia comes in today with chief complaint of Annual Exam (Just started having periods back 3 mths ago has not had this month yet. Vagina swelling when she has her period ) ? ? ?Diagnosis and orders addressed: ? ?1. Annual physical exam ?- Anemia Profile B ?- CMP14+EGFR ?- Lipid panel ?- TSH ?- Hepatitis C antibody ? ?2. Menorrhagia with regular cycle ?- CMP14+EGFR ?- Ambulatory referral to Gynecology ? ?3. Smoker ?- CMP14+EGFR ? ?4. Obesity (BMI 30-39.9) ?- CMP14+EGFR ? ?5. Iron deficiency anemia, unspecified iron deficiency anemia type ?- CMP14+EGFR ?- Ambulatory referral to Gynecology ? ?6. Constipation, unspecified constipation type ?Start Linzess  ?Force fluids ?- linaclotide (LINZESS) 145 MCG CAPS capsule; Take 1 capsule (145 mcg total) by mouth daily before breakfast.  Dispense: 90 capsule; Refill: 1 ? ? ?Labs pending ?Health Maintenance reviewed ?Diet and exercise encouraged ? ?Follow up plan: ?Follow up with GYN ? ? ?Christy Hawks, FNP ? ? ?

## 2022-02-14 LAB — ANEMIA PROFILE B
Basophils Absolute: 0.1 10*3/uL (ref 0.0–0.2)
Basos: 1 %
EOS (ABSOLUTE): 0.1 10*3/uL (ref 0.0–0.4)
Eos: 1 %
Ferritin: 39 ng/mL (ref 15–150)
Folate: 11.6 ng/mL (ref >3.0)
Hematocrit: 44 % (ref 34.0–46.6)
Hemoglobin: 14.7 g/dL (ref 11.1–15.9)
Immature Grans (Abs): 0 10*3/uL (ref 0.0–0.1)
Immature Granulocytes: 0 %
Iron Saturation: 37 % (ref 15–55)
Iron: 104 ug/dL (ref 27–159)
Lymphocytes Absolute: 1.9 10*3/uL (ref 0.7–3.1)
Lymphs: 30 %
MCH: 29.2 pg (ref 26.6–33.0)
MCHC: 33.4 g/dL (ref 31.5–35.7)
MCV: 87 fL (ref 79–97)
Monocytes Absolute: 0.6 10*3/uL (ref 0.1–0.9)
Monocytes: 10 %
Neutrophils Absolute: 3.6 10*3/uL (ref 1.4–7.0)
Neutrophils: 58 %
Platelets: 240 10*3/uL (ref 150–450)
RBC: 5.04 x10E6/uL (ref 3.77–5.28)
RDW: 13.2 % (ref 11.7–15.4)
Retic Ct Pct: 1.2 % (ref 0.6–2.6)
Total Iron Binding Capacity: 283 ug/dL (ref 250–450)
UIBC: 179 ug/dL (ref 131–425)
Vitamin B-12: 199 pg/mL — ABNORMAL LOW (ref 232–1245)
WBC: 6.2 10*3/uL (ref 3.4–10.8)

## 2022-02-14 LAB — CMP14+EGFR
ALT: 15 IU/L (ref 0–32)
AST: 18 IU/L (ref 0–40)
Albumin/Globulin Ratio: 2.3 — ABNORMAL HIGH (ref 1.2–2.2)
Albumin: 4.1 g/dL (ref 3.8–4.8)
Alkaline Phosphatase: 73 IU/L (ref 44–121)
BUN/Creatinine Ratio: 12 (ref 9–23)
BUN: 9 mg/dL (ref 6–20)
Bilirubin Total: 0.3 mg/dL (ref 0.0–1.2)
CO2: 21 mmol/L (ref 20–29)
Calcium: 9.3 mg/dL (ref 8.7–10.2)
Chloride: 107 mmol/L — ABNORMAL HIGH (ref 96–106)
Creatinine, Ser: 0.75 mg/dL (ref 0.57–1.00)
Globulin, Total: 1.8 g/dL (ref 1.5–4.5)
Glucose: 83 mg/dL (ref 70–99)
Potassium: 4.2 mmol/L (ref 3.5–5.2)
Sodium: 140 mmol/L (ref 134–144)
Total Protein: 5.9 g/dL — ABNORMAL LOW (ref 6.0–8.5)
eGFR: 104 mL/min/{1.73_m2} (ref >59)

## 2022-02-14 LAB — LIPID PANEL
Chol/HDL Ratio: 5.7 ratio — ABNORMAL HIGH (ref 0.0–4.4)
Cholesterol, Total: 193 mg/dL (ref 100–199)
HDL: 34 mg/dL — ABNORMAL LOW (ref >39)
LDL Chol Calc (NIH): 132 mg/dL — ABNORMAL HIGH (ref 0–99)
Triglycerides: 147 mg/dL (ref 0–149)
VLDL Cholesterol Cal: 27 mg/dL (ref 5–40)

## 2022-02-14 LAB — HEPATITIS C ANTIBODY: Hep C Virus Ab: NONREACTIVE

## 2022-02-14 LAB — TSH: TSH: 2.1 u[IU]/mL (ref 0.450–4.500)

## 2022-02-16 ENCOUNTER — Telehealth: Payer: Self-pay | Admitting: Family

## 2022-02-19 NOTE — Telephone Encounter (Signed)
It appears GYN office attempted to call patient to make an appointment.  ?

## 2022-02-19 NOTE — Telephone Encounter (Signed)
Patient aware she has already got in touch with them ?

## 2022-02-20 ENCOUNTER — Ambulatory Visit (INDEPENDENT_AMBULATORY_CARE_PROVIDER_SITE_OTHER): Payer: Medicaid Other | Admitting: *Deleted

## 2022-02-20 DIAGNOSIS — D509 Iron deficiency anemia, unspecified: Secondary | ICD-10-CM

## 2022-02-20 MED ORDER — CYANOCOBALAMIN 1000 MCG/ML IJ SOLN
1000.0000 ug | Freq: Every day | INTRAMUSCULAR | Status: AC
  Administered 2022-02-20: 1000 ug via INTRAMUSCULAR

## 2022-02-20 MED ORDER — CYANOCOBALAMIN 1000 MCG/ML IJ SOLN
1000.0000 ug | Freq: Every day | INTRAMUSCULAR | Status: AC
  Administered 2022-02-23 – 2022-02-26 (×4): 1000 ug via INTRAMUSCULAR

## 2022-02-20 NOTE — Progress Notes (Signed)
1st B12 injection left deltoid ?To be given daily for 1 week ?Then weekly for 1 mos ?Then monthly ?Pt tolerated well ?

## 2022-02-23 ENCOUNTER — Ambulatory Visit (INDEPENDENT_AMBULATORY_CARE_PROVIDER_SITE_OTHER): Payer: Medicaid Other | Admitting: *Deleted

## 2022-02-23 DIAGNOSIS — E538 Deficiency of other specified B group vitamins: Secondary | ICD-10-CM | POA: Diagnosis not present

## 2022-02-23 NOTE — Progress Notes (Signed)
B12 right deltoid ?

## 2022-02-24 ENCOUNTER — Ambulatory Visit (INDEPENDENT_AMBULATORY_CARE_PROVIDER_SITE_OTHER): Payer: Medicaid Other

## 2022-02-24 DIAGNOSIS — E538 Deficiency of other specified B group vitamins: Secondary | ICD-10-CM

## 2022-02-24 NOTE — Progress Notes (Signed)
Cyanocobalamin injection given to left arm.  Patient tolerated well. ?

## 2022-02-25 ENCOUNTER — Ambulatory Visit (INDEPENDENT_AMBULATORY_CARE_PROVIDER_SITE_OTHER): Payer: Medicaid Other

## 2022-02-25 DIAGNOSIS — E538 Deficiency of other specified B group vitamins: Secondary | ICD-10-CM | POA: Diagnosis not present

## 2022-02-25 NOTE — Progress Notes (Signed)
Cyanocobalamin injection given to right deltoid.  Patient tolerated well. 

## 2022-02-26 ENCOUNTER — Ambulatory Visit (INDEPENDENT_AMBULATORY_CARE_PROVIDER_SITE_OTHER): Payer: Medicaid Other

## 2022-02-26 DIAGNOSIS — E538 Deficiency of other specified B group vitamins: Secondary | ICD-10-CM | POA: Diagnosis not present

## 2022-02-26 NOTE — Progress Notes (Signed)
Cyanocobalamin injection given to left deltoid.  Patient tolerated well. 

## 2022-03-06 ENCOUNTER — Encounter (HOSPITAL_COMMUNITY): Payer: Self-pay | Admitting: Hematology and Oncology

## 2022-03-06 ENCOUNTER — Ambulatory Visit (INDEPENDENT_AMBULATORY_CARE_PROVIDER_SITE_OTHER): Payer: Medicaid Other | Admitting: *Deleted

## 2022-03-06 DIAGNOSIS — E538 Deficiency of other specified B group vitamins: Secondary | ICD-10-CM

## 2022-03-06 MED ORDER — CYANOCOBALAMIN 1000 MCG/ML IJ SOLN
1000.0000 ug | Freq: Once | INTRAMUSCULAR | Status: AC
  Administered 2022-03-06: 1000 ug via INTRAMUSCULAR

## 2022-03-11 ENCOUNTER — Encounter (HOSPITAL_COMMUNITY): Payer: Self-pay | Admitting: Hematology and Oncology

## 2022-03-13 ENCOUNTER — Other Ambulatory Visit (HOSPITAL_COMMUNITY)
Admission: RE | Admit: 2022-03-13 | Discharge: 2022-03-13 | Disposition: A | Discharge status: Home / Self Care | Payer: Medicaid Other | Source: Ambulatory Visit | Attending: Women's Health | Admitting: Women's Health

## 2022-03-13 ENCOUNTER — Encounter: Payer: Self-pay | Admitting: Women's Health

## 2022-03-13 ENCOUNTER — Ambulatory Visit (INDEPENDENT_AMBULATORY_CARE_PROVIDER_SITE_OTHER): Payer: Medicaid Other | Admitting: *Deleted

## 2022-03-13 ENCOUNTER — Ambulatory Visit: Payer: Medicaid Other | Admitting: Women's Health

## 2022-03-13 VITALS — BP 140/90 | HR 68 | Ht 62.0 in | Wt 194.2 lb

## 2022-03-13 DIAGNOSIS — N926 Irregular menstruation, unspecified: Secondary | ICD-10-CM

## 2022-03-13 DIAGNOSIS — N920 Excessive and frequent menstruation with regular cycle: Secondary | ICD-10-CM

## 2022-03-13 DIAGNOSIS — Z113 Encounter for screening for infections with a predominantly sexual mode of transmission: Secondary | ICD-10-CM | POA: Insufficient documentation

## 2022-03-13 DIAGNOSIS — Z124 Encounter for screening for malignant neoplasm of cervix: Secondary | ICD-10-CM | POA: Diagnosis not present

## 2022-03-13 DIAGNOSIS — E538 Deficiency of other specified B group vitamins: Secondary | ICD-10-CM | POA: Diagnosis not present

## 2022-03-13 MED ORDER — CYANOCOBALAMIN 1000 MCG/ML IJ SOLN
1000.0000 ug | Freq: Once | INTRAMUSCULAR | Status: AC
  Administered 2022-03-13: 1000 ug via INTRAMUSCULAR

## 2022-03-13 NOTE — Progress Notes (Signed)
? ?GYN VISIT ?Patient name: Mandy Garcia MRN [id number]  Date of birth: Oct 19, 1986 ?Chief Complaint:   ?Menorrhagia ? ?History of Present Illness:   ?Mandy Garcia is a 36 y.o. G39P3003 Caucasian female being seen today for heavy periods x 38mhs. Had D&C, hysteroscopy and minerva ablation 2019 for same, at that time was having to have weekly iron infusions.  Periods stopped until Dec 2022, has had 1 monthly since, lasts 10-11d, changes saturated pad q310m on 1st 5d, cramping at end only. Denies abnormal discharge, itching/odor/irritation.  No change in sex partners.  Had labs 02/13/22 w/ PCP: hgb 14.7, vit B low- starting injections; TSH was normal. ?Patient's last menstrual period was 03/03/2022. Period going off now ?The current method of family planning is tubal ligation.  ?Last pap 2017. Results were: NILM w/ HRHPV negative ? ? ?  03/13/2022  ?  9:32 AM 03/04/2020  ?  2:21 PM 03/07/2019  ?  9:12 AM 03/31/2018  ? 10:49 AM 09/27/2017  ?  8:44 AM  ?Depression screen PHQ 2/9  ?Decreased Interest 0 0 0 0 0  ?Down, Depressed, Hopeless 0 0 0 0 0  ?PHQ - 2 Score 0 0 0 0 0  ?Altered sleeping 1   0   ?Tired, decreased energy 1   0   ?Change in appetite 0   0   ?Feeling bad or failure about yourself  0   0   ?Trouble concentrating 0   0   ?Moving slowly or fidgety/restless 0   0   ?Suicidal thoughts 0   0   ?PHQ-9 Score 2   0   ? ?  ? ?  03/13/2022  ?  9:33 AM  ?GAD 7 : Generalized Anxiety Score  ?Nervous, Anxious, on Edge 0  ?Control/stop worrying 0  ?Worry too much - different things 0  ?Trouble relaxing 0  ?Restless 0  ?Easily annoyed or irritable 0  ?Afraid - awful might happen 0  ?Total GAD 7 Score 0  ? ? ? ?Review of Systems:   ?Pertinent items are noted in HPI ?Denies fever/chills, dizziness, headaches, visual disturbances, fatigue, shortness of breath, chest pain, abdominal pain, vomiting, abnormal vaginal discharge/itching/odor/irritation, problems with periods, bowel movements, urination, or intercourse  unless otherwise stated above.  ?Pertinent History Reviewed:  ?Reviewed past medical,surgical, social, obstetrical and family history.  ?Reviewed problem list, medications and allergies. ?Physical Assessment:  ? ?Vitals:  ? 03/13/22 0931  ?BP: 140/90  ?Pulse: 68  ?Weight: 194 lb 3.2 oz (88.1 kg)  ?Height: '5\' 2"'$  (1.575 m)  ?Body mass index is 35.52 kg/m?. ? ?     Physical Examination:  ? General appearance: alert, well appearing, and in no distress ? Mental status: alert, oriented to person, place, and time ? Skin: warm & dry  ? Cardiovascular: normal heart rate noted ? Respiratory: normal respiratory effort, no distress ? Abdomen: soft, non-tender  ? Pelvic: VULVA: normal appearing vulva with no masses, tenderness or lesions, VAGINA: normal appearing vagina with normal color and discharge, no lesions, small amt menstrual blood CERVIX: normal appearing cervix without discharge or lesions, UTERUS: uterus is normal size, shape, consistency and slightly tender, ADNEXA: normal adnexa in size, nontender and no masses ? Extremities: no edema  ? ?Chaperone: AmCelene Squibb ? ?No results found for this or any previous visit (from the past 24 hour(s)).  ?Assessment & Plan:  ?1) Menorrhagia w/ prolonged periods x 44m86m> s/p ablation in 2019 for same, hgb great at 14.7 (  on 02/13/22), tsh normal. Will check std/vaginitis on pap, get pelvic u/s and f/u w/ MD only (wants female) ? ?2) Past due for cervical cancer screening> pap today ? ?Meds: No orders of the defined types were placed in this encounter. ? ? ?Orders Placed This Encounter  ?Procedures  ? US PELVIS TRANSVAGINAL NON-OB (TV ONLY)  ? US PELVIS (TRANSABDOMINAL ONLY)  ? ? ?Return for 1st available, US:GYN and f/u w/ [facility] only after. ? ?Roma Schanz CNM, WHNP-BC ?03/13/2022 ?10:09 AM  ?

## 2022-03-16 LAB — CYTOLOGY - PAP
Chlamydia: NEGATIVE
Comment: NEGATIVE
Comment: NEGATIVE
Comment: NORMAL
Diagnosis: NEGATIVE
High risk HPV: NEGATIVE
Neisseria Gonorrhea: NEGATIVE

## 2022-03-19 ENCOUNTER — Ambulatory Visit (INDEPENDENT_AMBULATORY_CARE_PROVIDER_SITE_OTHER): Payer: Medicaid Other | Admitting: *Deleted

## 2022-03-19 ENCOUNTER — Encounter (HOSPITAL_COMMUNITY): Payer: Self-pay | Admitting: Hematology and Oncology

## 2022-03-19 DIAGNOSIS — E538 Deficiency of other specified B group vitamins: Secondary | ICD-10-CM | POA: Diagnosis not present

## 2022-03-19 MED ORDER — CYANOCOBALAMIN 1000 MCG/ML IJ SOLN
1000.0000 ug | Freq: Once | INTRAMUSCULAR | Status: AC
  Administered 2022-03-19: 1000 ug via INTRAMUSCULAR

## 2022-03-27 ENCOUNTER — Encounter (HOSPITAL_COMMUNITY): Payer: Self-pay | Admitting: Hematology and Oncology

## 2022-03-27 ENCOUNTER — Ambulatory Visit (INDEPENDENT_AMBULATORY_CARE_PROVIDER_SITE_OTHER): Payer: Medicaid Other | Admitting: Emergency Medicine

## 2022-03-27 DIAGNOSIS — E538 Deficiency of other specified B group vitamins: Secondary | ICD-10-CM | POA: Diagnosis not present

## 2022-03-27 DIAGNOSIS — D509 Iron deficiency anemia, unspecified: Secondary | ICD-10-CM

## 2022-03-27 MED ORDER — CYANOCOBALAMIN 1000 MCG/ML IJ SOLN
1000.0000 ug | Freq: Once | INTRAMUSCULAR | Status: AC
  Administered 2022-03-27: 1000 ug via INTRAMUSCULAR

## 2022-04-03 ENCOUNTER — Encounter (HOSPITAL_COMMUNITY): Payer: Self-pay | Admitting: Hematology and Oncology

## 2022-04-03 ENCOUNTER — Ambulatory Visit: Payer: Medicaid Other | Admitting: Obstetrics & Gynecology

## 2022-04-03 ENCOUNTER — Encounter: Payer: Self-pay | Admitting: Obstetrics & Gynecology

## 2022-04-03 ENCOUNTER — Ambulatory Visit: Payer: Medicaid Other | Admitting: Obstetrics and Gynecology

## 2022-04-03 ENCOUNTER — Ambulatory Visit (INDEPENDENT_AMBULATORY_CARE_PROVIDER_SITE_OTHER): Payer: Medicaid Other

## 2022-04-03 DIAGNOSIS — N920 Excessive and frequent menstruation with regular cycle: Secondary | ICD-10-CM

## 2022-04-03 DIAGNOSIS — N926 Irregular menstruation, unspecified: Secondary | ICD-10-CM

## 2022-04-03 NOTE — Progress Notes (Signed)
PELVIC US TA/TV: homogeneous subseptate uterus,EEC RT 3.6 mm,EEC LT 3.5 mm,normal ovaries,ovaries appear mobile,no free fluid,no pain during ultrasound ? ?Chaperone Peggy ?

## 2022-04-13 ENCOUNTER — Ambulatory Visit: Payer: Medicaid Other | Admitting: Obstetrics & Gynecology

## 2022-04-13 ENCOUNTER — Encounter: Payer: Self-pay | Admitting: Obstetrics & Gynecology

## 2022-04-13 VITALS — BP 124/78 | HR 70 | Ht 62.0 in | Wt 203.0 lb

## 2022-04-13 DIAGNOSIS — N920 Excessive and frequent menstruation with regular cycle: Secondary | ICD-10-CM | POA: Diagnosis not present

## 2022-04-13 DIAGNOSIS — F17200 Nicotine dependence, unspecified, uncomplicated: Secondary | ICD-10-CM | POA: Diagnosis not present

## 2022-04-13 MED ORDER — TRANEXAMIC ACID 650 MG PO TABS: 1300.0000 mg | ORAL_TABLET | Freq: Three times a day (TID) | ORAL | 6 refills | Status: AC

## 2022-04-13 NOTE — Progress Notes (Signed)
? ?  GYN VISIT ?Patient name: Mandy Garcia MRN [id number]  Date of birth: Jul 24, 1986 ?Chief Complaint:   ?Follow-up (Ultrasound done 04-04-22) ? ?History of Present Illness:   ?Mandy Garcia is a 36 y.o. G70P3003 female being seen today for HMB ? ?Heavy menstrual bleeding: S/p Minerva was not having a period; however in 2023 period returned. Notes for the past several months- heavy periods.  Menses last for 10-11 days.  Using pads- first 3 days, having to change her pad every 1 hour then every 1-2hr up to Day 5 then super light.  Also notes considerable dysmenorrhea.  Taking Advil with minimal improvement.   ?Menses ~ q 28days ? ?No longer smoking cigarettes, only vaping. ? ?Records reviewed- seen by Knute Neu- 3/31- work up completed including pelvic US ? ?03/25/2022: 6.3cm uterus- no abnormalities noted ? ?2019- Minerva ? ?Patient's last menstrual period was 04/06/2022. ? ? ?  03/13/2022  ?  9:32 AM 03/04/2020  ?  2:21 PM 03/07/2019  ?  9:12 AM 03/31/2018  ? 10:49 AM 09/27/2017  ?  8:44 AM  ?Depression screen PHQ 2/9  ?Decreased Interest 0 0 0 0 0  ?Down, Depressed, Hopeless 0 0 0 0 0  ?PHQ - 2 Score 0 0 0 0 0  ?Altered sleeping 1   0   ?Tired, decreased energy 1   0   ?Change in appetite 0   0   ?Feeling bad or failure about yourself  0   0   ?Trouble concentrating 0   0   ?Moving slowly or fidgety/restless 0   0   ?Suicidal thoughts 0   0   ?PHQ-9 Score 2   0   ? ? ? ?Review of Systems:   ?Pertinent items are noted in HPI ?Denies fever/chills, dizziness, headaches, visual disturbances, fatigue, shortness of breath, chest pain, abdominal pain, vomiting, bowel movements, urination, or intercourse unless otherwise stated above.  ?Pertinent History Reviewed:  ?Reviewed past medical,surgical, social, obstetrical and family history.  ?Reviewed problem list, medications and allergies. ?OBhx: NSVD x 3 ?Physical Assessment:  ? ?Vitals:  ? 04/13/22 1058  ?BP: 124/78  ?Pulse: 70  ?Weight: 203 lb (92.1 kg)  ?Height: 5'  2" (1.575 m)  ?Body mass index is 37.13 kg/m?. ? ?     Physical Examination:  ? General appearance: alert, well appearing, and in no distress ? Psych: mood appropriate, normal affect ? Skin: warm & dry  ? Cardiovascular: normal heart rate noted ? Respiratory: normal respiratory effort, no distress ? Abdomen: soft, non-tender, no reproducible pain ? Pelvic: examination not indicated ? Extremities: no edema  ? ?Chaperone: N/A   ? ?Assessment & Plan:  ?1) HMB ?-due to tobacco use, OCPs contraindicated ?-discussed progesterone only options, TXA or surgical intervention via hysterectomy ?-risk/benefit of each option reviewed ?-pt concerned about being able to take time away from work ?-TXA sent in, f/u in 54mo may reconsider options ? ?Meds ordered this encounter  ?Medications  ? tranexamic acid (LYSTEDA) 650 MG TABS tablet  ?  Sig: Take 2 tablets (1,300 mg total) by mouth 3 (three) times daily for 5 days. Take tablets only during your period  ?  Dispense:  30 tablet  ?  Refill:  6  ? ? ? ?Return in about 3 months (around 07/14/2022) for with Dr. ONelda Marseille ? ? ?JJanyth Pupa DO ?[facility]? ? ? ?

## 2022-05-01 ENCOUNTER — Ambulatory Visit: Payer: Medicaid Other

## 2022-05-01 ENCOUNTER — Ambulatory Visit (INDEPENDENT_AMBULATORY_CARE_PROVIDER_SITE_OTHER): Payer: Medicaid Other | Admitting: Emergency Medicine

## 2022-05-01 DIAGNOSIS — E538 Deficiency of other specified B group vitamins: Secondary | ICD-10-CM | POA: Diagnosis not present

## 2022-05-01 MED ORDER — CYANOCOBALAMIN 1000 MCG/ML IJ SOLN
1000.0000 ug | Freq: Once | INTRAMUSCULAR | Status: AC
  Administered 2022-05-01: 1000 ug via INTRAMUSCULAR

## 2022-05-01 NOTE — Progress Notes (Signed)
Patient presents for B12 injection. Patient tolerated well.   Given in Right Deltoid   Mandy Garcia. LPN

## 2022-06-05 ENCOUNTER — Ambulatory Visit (INDEPENDENT_AMBULATORY_CARE_PROVIDER_SITE_OTHER): Payer: Medicaid Other

## 2022-06-05 DIAGNOSIS — E538 Deficiency of other specified B group vitamins: Secondary | ICD-10-CM

## 2022-06-05 MED ORDER — CYANOCOBALAMIN 1000 MCG/ML IJ SOLN
1000.0000 ug | INTRAMUSCULAR | Status: AC
  Administered 2022-06-05: 1000 ug via INTRAMUSCULAR

## 2022-06-05 NOTE — Progress Notes (Signed)
Cyanocobalamin injection given to left deltoid.  Patient tolerated well. 

## 2022-07-03 ENCOUNTER — Encounter (HOSPITAL_COMMUNITY): Payer: Self-pay | Admitting: Hematology and Oncology

## 2022-07-10 ENCOUNTER — Ambulatory Visit: Payer: Medicaid Other

## 2022-07-18 DIAGNOSIS — Z881 Allergy status to other antibiotic agents status: Secondary | ICD-10-CM | POA: Diagnosis not present

## 2022-07-18 DIAGNOSIS — X58XXXA Exposure to other specified factors, initial encounter: Secondary | ICD-10-CM | POA: Diagnosis not present

## 2022-07-18 DIAGNOSIS — Z87891 Personal history of nicotine dependence: Secondary | ICD-10-CM | POA: Diagnosis not present

## 2022-07-18 DIAGNOSIS — R21 Rash and other nonspecific skin eruption: Secondary | ICD-10-CM | POA: Diagnosis not present

## 2022-07-18 DIAGNOSIS — Z885 Allergy status to narcotic agent status: Secondary | ICD-10-CM | POA: Diagnosis not present

## 2022-07-18 DIAGNOSIS — S6991XA Unspecified injury of right wrist, hand and finger(s), initial encounter: Secondary | ICD-10-CM | POA: Diagnosis not present

## 2022-07-18 DIAGNOSIS — S6992XA Unspecified injury of left wrist, hand and finger(s), initial encounter: Secondary | ICD-10-CM | POA: Diagnosis not present

## 2022-07-21 ENCOUNTER — Encounter: Payer: Self-pay | Admitting: Family

## 2022-07-21 ENCOUNTER — Ambulatory Visit (INDEPENDENT_AMBULATORY_CARE_PROVIDER_SITE_OTHER): Payer: 59 | Admitting: Family

## 2022-07-21 ENCOUNTER — Encounter (HOSPITAL_COMMUNITY): Payer: Self-pay | Admitting: Hematology and Oncology

## 2022-07-21 VITALS — BP 126/82 | HR 86 | Temp 98.3°F | Ht 62.0 in | Wt 194.2 lb

## 2022-07-21 DIAGNOSIS — Z09 Encounter for follow-up examination after completed treatment for conditions other than malignant neoplasm: Secondary | ICD-10-CM

## 2022-07-21 DIAGNOSIS — L301 Dyshidrosis [pompholyx]: Secondary | ICD-10-CM | POA: Diagnosis not present

## 2022-07-21 MED ORDER — TRIAMCINOLONE ACETONIDE 0.5 % EX OINT: 1.0000 | TOPICAL_OINTMENT | Freq: Two times a day (BID) | CUTANEOUS | 2 refills | Status: DC

## 2022-07-21 MED ORDER — HYDROXYZINE HCL 25 MG PO TABS: 25.0000 mg | ORAL_TABLET | Freq: Three times a day (TID) | ORAL | 2 refills | Status: DC | PRN

## 2022-07-21 NOTE — Progress Notes (Signed)
   Subjective:    Patient ID: Mandy Garcia, female    DOB: 08-Feb-1986, 36 y.o.   MRN: [id number]  Chief Complaint  Patient presents with   Follow-up   Pt presents to the office today for ED follow up on rash on bilateral hands. She was diagnosed dyshidrotic eczema. She was given Kenalog cream and Vistaril 25 mg TID prn. Rash This is a new problem. The current episode started 1 to 4 weeks ago. The problem has been waxing and waning since onset. The rash is characterized by itchiness, dryness and peeling. She was exposed to nothing.      Review of Systems  Skin:  Positive for rash.  All other systems reviewed and are negative.      Objective:   Physical Exam Vitals reviewed.  Constitutional:      General: She is not in acute distress.    Appearance: She is well-developed.  HENT:     Head: Normocephalic and atraumatic.     Right Ear: Tympanic membrane normal.     Left Ear: Tympanic membrane normal.  Eyes:     Pupils: Pupils are equal, round, and reactive to light.  Neck:     Thyroid: No thyromegaly.  Cardiovascular:     Rate and Rhythm: Normal rate and regular rhythm.     Heart sounds: Normal heart sounds. No murmur heard. Pulmonary:     Effort: Pulmonary effort is normal. No respiratory distress.     Breath sounds: Normal breath sounds. No wheezing.  Abdominal:     General: Bowel sounds are normal. There is no distension.     Palpations: Abdomen is soft.     Tenderness: There is no abdominal tenderness.  Musculoskeletal:        General: No tenderness. Normal range of motion.     Cervical back: Normal range of motion and neck supple.  Skin:    General: Skin is warm and dry.     Comments: Bilateral dry hand and peeling skin  Neurological:     Mental Status: She is alert and oriented to person, place, and time.     Cranial Nerves: No cranial nerve deficit.     Deep Tendon Reflexes: Reflexes are normal and symmetric.  Psychiatric:        Behavior: Behavior  normal.        Thought Content: Thought content normal.        Judgment: Judgment normal.      BP 126/82   Pulse 86   Temp 98.3 F (36.8 C) (Temporal)   Ht '5\' 2"'$  (1.575 m)   Wt 194 lb 3.2 oz (88.1 kg)   BMI 35.52 kg/m       Assessment & Plan:  Mandy Garcia comes in today with chief complaint of Follow-up   Diagnosis and orders addressed:  1. Dyshidrotic eczema - triamcinolone ointment (KENALOG) 0.5 %; Apply 1 Application topically 2 (two) times daily.  Dispense: 60 g; Refill: 2 - hydrOXYzine (ATARAX) 25 MG tablet; Take 1 tablet (25 mg total) by mouth every 8 (eight) hours as needed.  Dispense: 90 tablet; Refill: 2  2. Hospital discharge follow-up - hydrOXYzine (ATARAX) 25 MG tablet; Take 1 tablet (25 mg total) by mouth every 8 (eight) hours as needed.  Dispense: 90 tablet; Refill: 2  Continue Kenalog 0.5% Avoid allergens and irritants Use moisturizer Follow up if symptoms worsen or do not improve       Evelina Dun, FNP

## 2022-07-21 NOTE — Patient Instructions (Signed)
Dyshidrotic Eczema Dyshidrotic eczema, also known as pompholyx, is a type of eczema that causes very itchy, fluid-filled blisters (vesicles) to form on the hands and feet. It is more common before age 37, though it can affect people of any age. There is no cure, but treatment and certain lifestyle changes can help relieve symptoms. What are the causes? The cause of this condition is not known. What increases the risk? You are more likely to develop this condition if: You wash your hands frequently. You have a personal or family history of eczema, allergies, asthma, or hay fever. You are allergic to metals, such as nickel or cobalt. You work with cement. You smoke. What are the signs or symptoms? Symptoms of this condition may affect the hands, the feet, or both. Symptoms may come and go (recur), and may include: Severe itching. This may happen before blisters appear. Blisters. These may form suddenly. In the early stages, blisters may form near the fingertips. In severe cases, blisters may grow to large blister masses (bullae). Blisters resolve in 2-3 weeks without bursting. This is followed by a dry phase in which itching eases. Pain and swelling. Cracks or long, narrow openings (fissures) in the skin. Severe dryness. Ridges on the nails. How is this diagnosed? This condition may be diagnosed based on: Your symptoms and a physical exam. Your medical history. Skin scrapings to rule out a fungal infection. Testing a swab of fluid for bacteria (culture). Removing a small piece of skin (biopsy) to test for infection or to rule out other conditions. Skin patch tests. These tests involve using patches that contain possible allergens and placing them on your back. Your health care provider will wait a few days and then check to see if an allergic reaction occurred. These tests may be done if your health care provider suspects allergic reactions, or to rule out other types of eczema. You may  be referred to a health care provider who specializes in skin conditions (dermatologist) to help diagnose and treat this condition. How is this treated? There is no cure for this condition, but treatment can help relieve symptoms. Depending on the amount and severity of the blisters, your health care provider may suggest: Avoiding allergens, irritants, or triggers that worsen symptoms. This may involve lifestyle changes, such as: Using different lotions or soaps. Avoiding hot weather or places that will cause you to sweat a lot. Managing stress with coping techniques, such as relaxation and exercise, and asking for help when you need it. Diet changes as recommended by your health care provider. Using a clean, damp towel (cool compress) to relieve symptoms. Soaking in a bath that contains a type of salt that relieves irritation (aluminum acetate soaks). Medicines, such as: Medicine taken by mouth to reduce itching (oral antihistamines). Medicine applied to the skin to reduce swelling and irritation (topical corticosteroids). Medicine that reduces the activity of the body's disease-fighting system (immunosuppressants) to treat inflammation. This may be given in severe cases. Antibiotic medicines to treat bacterial infection. Light therapy (phototherapy). This involves shining ultraviolet (UV) light on the affected skin in order to reduce itchiness and inflammation. Follow these instructions at home: Bathing and skin care  Wash skin gently. After bathing or washing your hands, pat your skin dry. Avoid rubbing your skin. Remove all jewelry before bathing. If the skin under the jewelry stays wet, blisters may form or get worse. Apply cool compresses as told by your health care provider. To do this: Soak a clean towel in  cool water. Wring out excess water until towel is damp. Place the towel over the affected skin. Leave the towel on for 20 minutes at a time, 2-3 times a day. Use mild soaps,  cleansers, and lotions that do not contain dyes, perfumes, or other irritants. Keep your skin hydrated. To do this: Avoid very hot water. Take lukewarm baths or showers. Apply moisturizer within 3 minutes of bathing. This locks in moisture. Medicines Take and apply over-the-counter and prescription medicines only as told by your health care provider. If you were prescribed an antibiotic medicine, take or apply it as told by your health care provider. Do not stop using the antibiotic even if you start to feel better. General instructions Do not use any products that contain nicotine or tobacco. These include cigarettes, chewing tobacco, and vaping devices, such as e-cigarettes. If you need help quitting, ask your health care provider. Identify and avoid triggers and allergens. Keep fingernails short to avoid breaking the skin while scratching. Use waterproof gloves to protect your hands when doing work that keeps your hands wet for a long time. Wear socks to keep your feet dry. Keep all follow-up visits. This is important. Contact a health care provider if: You have symptoms that do not go away. You have signs of infection, such as: Crusting, pus, or a bad smell. More redness, swelling, or pain. Increased warmth in the affected area. Get help right away if: Your skin gets streaking redness with associated pain. Summary Dyshidrotic eczema, also known as pompholyx, is a type of eczema that causes very itchy, fluid-filled blisters (vesicles) to form on the hands and feet. The cause of this condition is not known. There is no cure for this condition, but treatment can help relieve symptoms. Treatment depends on the amount and severity of the blisters. Use mild soaps, cleansers, and lotions that do not contain dyes, perfumes, or other irritants. Keep your skin hydrated. This information is not intended to replace advice given to you by your health care provider. Make sure you discuss any  questions you have with your health care provider. Document Revised: 09/09/2020 Document Reviewed: 09/09/2020 Elsevier Patient Education  2023 [employer]  

## 2022-08-25 ENCOUNTER — Encounter: Payer: Self-pay | Admitting: Nurse Practitioner

## 2022-08-25 ENCOUNTER — Telehealth (INDEPENDENT_AMBULATORY_CARE_PROVIDER_SITE_OTHER): Payer: 59 | Admitting: Nurse Practitioner

## 2022-08-25 DIAGNOSIS — J0110 Acute frontal sinusitis, unspecified: Secondary | ICD-10-CM

## 2022-08-25 MED ORDER — AMOXICILLIN-POT CLAVULANATE 875-125 MG PO TABS: 1.0000 | ORAL_TABLET | Freq: Two times a day (BID) | ORAL | 0 refills | Status: DC

## 2022-08-25 MED ORDER — ACETAMINOPHEN 500 MG PO TABS: 500.0000 mg | ORAL_TABLET | Freq: Four times a day (QID) | ORAL | 0 refills | Status: DC | PRN

## 2022-08-25 NOTE — Progress Notes (Signed)
   Virtual Visit  Note Due to COVID-19 pandemic this visit was conducted virtually. This visit type was conducted due to national recommendations for restrictions regarding the COVID-19 Pandemic (e.g. social distancing, sheltering in place) in an effort to limit this patient's exposure and mitigate transmission in our community. All issues noted in this document were discussed and addressed.  A physical exam was not performed with this format.  I connected with Mandy Garcia on 08/25/22 at 11:40 am  by telephone and verified that I am speaking with the correct person using two identifiers. Mandy Garcia is currently located at home during visit. The provider, Ivy Lynn, NP is located in their office at time of visit.  I discussed the limitations, risks, security and privacy concerns of performing an evaluation and management service by telephone and the availability of in person appointments. I also discussed with the patient that there may be a patient responsible charge related to this service. The patient expressed understanding and agreed to proceed.   History and Present Illness:  Sinusitis This is a new problem. The current episode started in the past 7 days. The problem is unchanged. There has been no fever. The pain is moderate. Associated symptoms include congestion, headaches, sinus pressure and a sore throat. Pertinent negatives include no chills or neck pain. Past treatments include nothing.      Review of Systems  Constitutional: Negative.  Negative for chills and fever.  HENT:  Positive for congestion, sinus pressure and sore throat.   Respiratory: Negative.    Cardiovascular: Negative.   Musculoskeletal:  Negative for neck pain.  Skin: Negative.  Negative for itching and rash.  Neurological:  Positive for headaches.  All other systems reviewed and are negative.    Observations/Objective: Tele-visit patient is not in distress  Assessment and  Plan: Patient present with s/s of frontal sinusitis Take meds as prescribed - Use a cool mist humidifier  -Use saline nose sprays frequently -Force fluids -Augmentin 875-125 mg tablet by mouth daily. -For fever or aches or pains- take Tylenol or ibuprofen. -If symptoms do not improve, she may need to be COVID tested to rule this out Follow up with worsening unresolved symptoms   Follow Up Instructions: Follow up with unresolved symptoms    I discussed the assessment and treatment plan with the patient. The patient was provided an opportunity to ask questions and all were answered. The patient agreed with the plan and demonstrated an understanding of the instructions.   The patient was advised to call back or seek an in-person evaluation if the symptoms worsen or if the condition fails to improve as anticipated.  The above assessment and management plan was discussed with the patient. The patient verbalized understanding of and has agreed to the management plan. Patient is aware to call the clinic if symptoms persist or worsen. Patient is aware when to return to the clinic for a follow-up visit. Patient educated on when it is appropriate to go to the emergency department.   Time call ended:  11:52 am   I provided 12 minutes of  non face-to-face time during this encounter.    Ivy Lynn, NP

## 2022-09-08 ENCOUNTER — Ambulatory Visit (INDEPENDENT_AMBULATORY_CARE_PROVIDER_SITE_OTHER): Payer: 59 | Admitting: Family

## 2022-09-08 ENCOUNTER — Encounter: Payer: Self-pay | Admitting: Family

## 2022-09-08 ENCOUNTER — Ambulatory Visit (INDEPENDENT_AMBULATORY_CARE_PROVIDER_SITE_OTHER): Payer: 59

## 2022-09-08 VITALS — BP 111/74 | HR 90 | Temp 97.6°F | Ht 62.0 in | Wt 196.0 lb

## 2022-09-08 DIAGNOSIS — J189 Pneumonia, unspecified organism: Secondary | ICD-10-CM

## 2022-09-08 DIAGNOSIS — R062 Wheezing: Secondary | ICD-10-CM | POA: Diagnosis not present

## 2022-09-08 DIAGNOSIS — J209 Acute bronchitis, unspecified: Secondary | ICD-10-CM

## 2022-09-08 DIAGNOSIS — R051 Acute cough: Secondary | ICD-10-CM | POA: Diagnosis not present

## 2022-09-08 DIAGNOSIS — R0602 Shortness of breath: Secondary | ICD-10-CM

## 2022-09-08 MED ORDER — LEVOFLOXACIN 500 MG PO TABS: 500.0000 mg | ORAL_TABLET | Freq: Every day | ORAL | 0 refills | Status: AC

## 2022-09-08 MED ORDER — ALBUTEROL SULFATE HFA 108 (90 BASE) MCG/ACT IN AERS: 2.0000 | INHALATION_SPRAY | Freq: Four times a day (QID) | RESPIRATORY_TRACT | 0 refills | Status: DC | PRN

## 2022-09-08 MED ORDER — PREDNISONE 10 MG (21) PO TBPK: ORAL_TABLET | ORAL | 0 refills | Status: DC

## 2022-09-08 NOTE — Progress Notes (Addendum)
Subjective:    Patient ID: Mandy Garcia, female    DOB: 05-05-1986, 36 y.o.   MRN: [id number]  Chief Complaint  Patient presents with   Shortness of Breath    Vapes hurting in ribs hurts when she breathes in   PT presents to the office today with SOB that started a week ago. Did a home COVID test that was negative. She had a video visit on 08/25/22 and was diagnosed with sinusitis and given Augmentin. She felt better for a few days.  Shortness of Breath Associated symptoms include a fever, rhinorrhea and wheezing. Pertinent negatives include no ear pain or headaches.  Cough This is a new problem. The current episode started 1 to 4 weeks ago. The problem has been gradually improving. The problem occurs every few minutes. The cough is Productive of sputum. Associated symptoms include a fever, nasal congestion, rhinorrhea, shortness of breath and wheezing. Pertinent negatives include no chills, ear congestion, ear pain, headaches or myalgias. Risk factors for lung disease include smoking/tobacco exposure. She has tried rest for the symptoms.      Review of Systems  Constitutional:  Positive for fever. Negative for chills.  HENT:  Positive for rhinorrhea. Negative for ear pain.   Respiratory:  Positive for cough, shortness of breath and wheezing.   Musculoskeletal:  Negative for myalgias.  Neurological:  Negative for headaches.  All other systems reviewed and are negative.      Objective:   Physical Exam Vitals reviewed.  Constitutional:      General: She is not in acute distress.    Appearance: She is well-developed.  HENT:     Head: Normocephalic and atraumatic.     Right Ear: External ear normal.  Eyes:     Pupils: Pupils are equal, round, and reactive to light.  Neck:     Thyroid: No thyromegaly.  Cardiovascular:     Rate and Rhythm: Normal rate and regular rhythm.     Heart sounds: Normal heart sounds. No murmur heard. Pulmonary:     Effort: Pulmonary effort is  normal. No respiratory distress.     Breath sounds: Decreased breath sounds and wheezing present.  Abdominal:     General: Bowel sounds are normal. There is no distension.     Palpations: Abdomen is soft.     Tenderness: There is no abdominal tenderness.  Musculoskeletal:        General: No tenderness. Normal range of motion.     Cervical back: Normal range of motion and neck supple.  Skin:    General: Skin is warm and dry.  Neurological:     Mental Status: She is alert and oriented to person, place, and time.     Cranial Nerves: No cranial nerve deficit.     Deep Tendon Reflexes: Reflexes are normal and symmetric.  Psychiatric:        Behavior: Behavior normal.        Thought Content: Thought content normal.        Judgment: Judgment normal.      BP 111/74   Pulse 90   Temp 97.6 F (36.4 C) (Temporal)   Ht '5\' 2"'$  (1.575 m)   Wt 196 lb (88.9 kg)   SpO2 95%   BMI 35.85 kg/m       Assessment & Plan:   Linea Calles comes in today with chief complaint of Shortness of Breath (Vapes hurting in ribs hurts when she breathes in)   Diagnosis and orders addressed:  1. Acute bronchitis, unspecified organism - Take meds as prescribed - Use a cool mist humidifier  -Use saline nose sprays frequently -Force fluids -For any cough or congestion  Use plain Mucinex- regular strength or max strength is fine -For fever or aces or pains- take tylenol or ibuprofen. -Throat lozenges if help -Follow up symptoms worsen or do not improve  - predniSONE (STERAPRED UNI-PAK 21 TAB) 10 MG (21) TBPK tablet; Use as directed  Dispense: 21 tablet; Refill: 0 - albuterol (VENTOLIN HFA) 108 (90 Base) MCG/ACT inhaler; Inhale 2 puffs into the lungs every 6 (six) hours as needed for wheezing or shortness of breath.  Dispense: 8 g; Refill: 0 - DG Chest 2 View  2. SOB (shortness of breath)  3. Acute cough  4. Community acquired pneumonia, unspecified laterality Start Levaquin  Follow up in 1  week in office then 4 weeks to recheck x-ray - levofloxacin (LEVAQUIN) 500 MG tablet; Take 1 tablet (500 mg total) by mouth daily for 7 days.  Dispense: 7 tablet; Refill: 0  Evelina Dun, FNP

## 2022-09-08 NOTE — Patient Instructions (Signed)

## 2022-09-08 NOTE — Addendum Note (Signed)
Addended by: Evelina Dun A on: 09/08/2022 08:43 AM   Modules accepted: Orders

## 2022-09-18 ENCOUNTER — Encounter: Payer: Self-pay | Admitting: Family

## 2022-09-18 ENCOUNTER — Ambulatory Visit: Payer: 59

## 2022-09-18 ENCOUNTER — Ambulatory Visit (INDEPENDENT_AMBULATORY_CARE_PROVIDER_SITE_OTHER): Payer: 59 | Admitting: Family

## 2022-09-18 VITALS — BP 116/80 | HR 82 | Temp 97.5°F | Ht 62.0 in | Wt 197.0 lb

## 2022-09-18 DIAGNOSIS — J189 Pneumonia, unspecified organism: Secondary | ICD-10-CM

## 2022-09-18 DIAGNOSIS — H9203 Otalgia, bilateral: Secondary | ICD-10-CM

## 2022-09-18 NOTE — Patient Instructions (Signed)
Community-Acquired Pneumonia, Adult Pneumonia is a lung infection that causes inflammation and the buildup of mucus and fluids in the lungs. This may cause coughing and difficulty breathing. Community-acquired pneumonia is pneumonia that develops in people who are not, and have not recently been, in a hospital or other health care facility. Usually, pneumonia develops as a result of an illness that is caused by a virus, such as the common cold and the flu (influenza). It can also be caused by bacteria or fungi. While the common cold and influenza can pass from person to person (are contagious), pneumonia itself is not considered contagious. What are the causes? This condition may be caused by: Viruses. Bacteria. Fungi. What increases the risk? The following factors may make you more likely to develop this condition: Being over age 65 or having certain medical conditions, such as: A long-term (chronic) disease, such as: chronic obstructive pulmonary disease (COPD), asthma, heart failure, diabetes, or kidney disease. A condition that increases the risk of breathing in (aspirating) mucus and other fluids from your mouth and nose. A weakened body defense system (immune system). Having had your spleen removed (splenectomy). The spleen is the organ that helps fight germs and infections. Not cleaning your teeth and gums well (poor dental hygiene). Using tobacco products. Traveling to places where germs that cause pneumonia are present or being near certain animals or animal habitats that could have germs that cause pneumonia. What are the signs or symptoms? Symptoms of this condition include: A dry cough or a wet (productive) cough. A fever, sweating, or chills. Chest pain, especially when breathing deeply or coughing. Fast breathing, difficulty breathing, or shortness of breath. Tiredness (fatigue) and muscle aches. How is this diagnosed? This condition may be diagnosed based on your medical  history or a physical exam. You may also have tests, including: Imaging, such as a chest X-ray or lung ultrasound. Tests of: The level of oxygen and other gases in your blood. Mucus from your lungs (sputum). Fluid around your lungs (pleural fluid). Your urine. How is this treated? Treatment for this condition depends on many factors, such as the cause of your pneumonia, your medicines, and other medical conditions that you have. For most adults, pneumonia may be treated at home. In some cases, treatment must happen in a hospital and may include: Medicines that are given by mouth (orally) or through an IV, including: Antibiotic medicines, if bacteria caused the pneumonia. Medicines that kill viruses (antiviral medicines), if a virus caused the pneumonia. Oxygen therapy. Severe pneumonia, although rare, may require the following treatments: Mechanical ventilation.This procedure uses a machine to help you breathe if you cannot breathe well on your own or maintain a safe level of blood oxygen. Thoracentesis. This procedure removes any buildup of pleural fluid to help with breathing. Follow these instructions at home:  Medicines Take over-the-counter and prescription medicines only as told by your health care provider. Take cough medicine only if you have trouble sleeping. Cough medicine can prevent your body from removing mucus from your lungs. If you were prescribed antibiotics, take them as told by your health care provider. Do not stop taking the antibiotic even if you start to feel better. Lifestyle     Do not drink alcohol. Do not use any products that contain nicotine or tobacco. These products include cigarettes, chewing tobacco, and vaping devices, such as e-cigarettes. If you need help quitting, ask your health care provider. Eat a healthy diet. This includes plenty of vegetables, fruits, whole grains, low-fat   dairy products, and lean protein. General instructions Rest a lot and  get at least 8 hours of sleep each night. Sleep in a partly upright position at night. Place a few pillows under your head or sleep in a reclining chair. Return to your normal activities as told by your health care provider. Ask your health care provider what activities are safe for you. Drink enough fluid to keep your urine pale yellow. This helps to thin the mucus in your lungs. If your throat is sore, gargle with a mixture of salt and water 3-4 times a day or as needed. To make salt water, completely dissolve -1 tsp (3-6 g) of salt in 1 cup (237 mL) of warm water. Keep all follow-up visits. How is this prevented? You can lower your risk of developing community-acquired pneumonia by: Getting the pneumonia vaccine. There are different types and schedules of pneumonia vaccines. Ask your health care provider which option is best for you. Consider getting the pneumonia vaccine if: You are older than 38 years of age. You are 19-65 years of age and are receiving cancer treatment, have chronic lung disease, or have other medical conditions that affect your immune system. Ask your health care provider if this applies to you. Getting your influenza vaccine every year. Ask your health care provider which type of vaccine is best for you. Getting regular dental checkups. Washing your hands often with soap and water for at least 20 seconds. If soap and water are not available, use hand sanitizer. Contact a health care provider if: You have a fever. You have trouble sleeping because you cannot control your cough with cough medicine. Get help right away if: Your shortness of breath becomes worse. Your chest pain increases. Your sickness becomes worse, especially if you are an older adult or have a weak immune system. You cough up blood. These symptoms may be an emergency. Get help right away. Call 911. Do not wait to see if the symptoms will go away. Do not drive yourself to the  hospital. Summary Pneumonia is an infection of the lungs. Community-acquired pneumonia develops in people who have not been in the hospital. It can be caused by bacteria, viruses, or fungi. This condition may be treated with antibiotics or antiviral medicines. Severe pneumonia may require a hospital stay and treatment to help with breathing. This information is not intended to replace advice given to you by your health care provider. Make sure you discuss any questions you have with your health care provider. Document Revised: 01/28/2022 Document Reviewed: 01/28/2022 Elsevier Patient Education  2023 [employer]  

## 2022-09-18 NOTE — Progress Notes (Signed)
Subjective:    Patient ID: Mandy Garcia, female    DOB: 1986/01/14, 36 y.o.   MRN: [id number]  Chief Complaint  Patient presents with   Follow-up   PT presents to the office today to recheck CAP. She was seen on 09/08/22 and had a chest x-ray that showed, "Bibasilar airspace opacities, concerning for multifocal pneumonia." She was started on Levofloxacin daily, prednisone, and albuterol. She has completed her antibiotics.  Reports her breathing is improved, continues to have mild SOB.  Cough This is a new problem. The current episode started in the past 7 days. The problem has been waxing and waning. The problem occurs every few minutes. The cough is Non-productive. Associated symptoms include ear pain, shortness of breath and wheezing. Pertinent negatives include no chills, ear congestion, fever, myalgias, nasal congestion or rhinorrhea. Risk factors for lung disease include smoking/tobacco exposure. She has tried rest for the symptoms. The treatment provided mild relief.  Otalgia  There is pain in both ears. The current episode started in the past 7 days. The problem occurs every few hours. There has been no fever. The pain is mild. Associated symptoms include coughing. Pertinent negatives include no rhinorrhea. She has tried acetaminophen and NSAIDs for the symptoms.      Review of Systems  Constitutional:  Negative for chills and fever.  HENT:  Positive for ear pain. Negative for rhinorrhea.   Respiratory:  Positive for cough, shortness of breath and wheezing.   Musculoskeletal:  Negative for myalgias.  All other systems reviewed and are negative.      Objective:   Physical Exam Vitals reviewed.  Constitutional:      General: She is not in acute distress.    Appearance: She is well-developed.  HENT:     Head: Normocephalic and atraumatic.     Right Ear: A middle ear effusion is present.     Left Ear: A middle ear effusion is present.  Eyes:     Pupils: Pupils are  equal, round, and reactive to light.  Neck:     Thyroid: No thyromegaly.  Cardiovascular:     Rate and Rhythm: Normal rate and regular rhythm.     Heart sounds: Normal heart sounds. No murmur heard. Pulmonary:     Effort: Pulmonary effort is normal. No respiratory distress.     Breath sounds: Normal breath sounds. No wheezing.  Abdominal:     General: Bowel sounds are normal. There is no distension.     Palpations: Abdomen is soft.     Tenderness: There is no abdominal tenderness.  Musculoskeletal:        General: No tenderness. Normal range of motion.     Cervical back: Normal range of motion and neck supple.  Skin:    General: Skin is warm and dry.  Neurological:     Mental Status: She is alert and oriented to person, place, and time.     Cranial Nerves: No cranial nerve deficit.     Deep Tendon Reflexes: Reflexes are normal and symmetric.  Psychiatric:        Behavior: Behavior normal.        Thought Content: Thought content normal.        Judgment: Judgment normal.     BP 116/80   Pulse 82   Temp (!) 97.5 F (36.4 C) (Temporal)   Ht '5\' 2"'$  (1.575 m)   Wt 197 lb (89.4 kg)   BMI 36.03 kg/m      Assessment &  Plan:  Mandy Garcia comes in today with chief complaint of Follow-up   Diagnosis and orders addressed:  1. Multifocal pneumonia - DG Chest 2 View; Future   2. Otalgia of both ears   Chest x-ray pending, repeat in 2 weeks Report any SOB, cough, and fever, red flags discussed  Continue Claritin  Call Monday and let me know if ear pain is improved Tylenol as needed   Evelina Dun, FNP

## 2022-10-02 ENCOUNTER — Ambulatory Visit (INDEPENDENT_AMBULATORY_CARE_PROVIDER_SITE_OTHER): Payer: 59

## 2022-10-02 ENCOUNTER — Other Ambulatory Visit: Payer: Self-pay | Admitting: Family

## 2022-10-02 ENCOUNTER — Encounter: Payer: Self-pay | Admitting: Family

## 2022-10-02 ENCOUNTER — Ambulatory Visit (INDEPENDENT_AMBULATORY_CARE_PROVIDER_SITE_OTHER): Payer: 59 | Admitting: Family

## 2022-10-02 VITALS — BP 114/86 | HR 77 | Temp 98.3°F | Resp 18 | Ht 62.0 in | Wt 194.0 lb

## 2022-10-02 DIAGNOSIS — J189 Pneumonia, unspecified organism: Secondary | ICD-10-CM

## 2022-10-02 MED ORDER — LEVOFLOXACIN 500 MG PO TABS: 500.0000 mg | ORAL_TABLET | Freq: Every day | ORAL | 0 refills | Status: AC

## 2022-10-02 NOTE — Patient Instructions (Signed)
Community-Acquired Pneumonia, Adult Pneumonia is a lung infection that causes inflammation and the buildup of mucus and fluids in the lungs. This may cause coughing and difficulty breathing. Community-acquired pneumonia is pneumonia that develops in people who are not, and have not recently been, in a hospital or other health care facility. Usually, pneumonia develops as a result of an illness that is caused by a virus, such as the common cold and the flu (influenza). It can also be caused by bacteria or fungi. While the common cold and influenza can pass from person to person (are contagious), pneumonia itself is not considered contagious. What are the causes? This condition may be caused by: Viruses. Bacteria. Fungi. What increases the risk? The following factors may make you more likely to develop this condition: Being over age 65 or having certain medical conditions, such as: A long-term (chronic) disease, such as: chronic obstructive pulmonary disease (COPD), asthma, heart failure, diabetes, or kidney disease. A condition that increases the risk of breathing in (aspirating) mucus and other fluids from your mouth and nose. A weakened body defense system (immune system). Having had your spleen removed (splenectomy). The spleen is the organ that helps fight germs and infections. Not cleaning your teeth and gums well (poor dental hygiene). Using tobacco products. Traveling to places where germs that cause pneumonia are present or being near certain animals or animal habitats that could have germs that cause pneumonia. What are the signs or symptoms? Symptoms of this condition include: A dry cough or a wet (productive) cough. A fever, sweating, or chills. Chest pain, especially when breathing deeply or coughing. Fast breathing, difficulty breathing, or shortness of breath. Tiredness (fatigue) and muscle aches. How is this diagnosed? This condition may be diagnosed based on your medical  history or a physical exam. You may also have tests, including: Imaging, such as a chest X-ray or lung ultrasound. Tests of: The level of oxygen and other gases in your blood. Mucus from your lungs (sputum). Fluid around your lungs (pleural fluid). Your urine. How is this treated? Treatment for this condition depends on many factors, such as the cause of your pneumonia, your medicines, and other medical conditions that you have. For most adults, pneumonia may be treated at home. In some cases, treatment must happen in a hospital and may include: Medicines that are given by mouth (orally) or through an IV, including: Antibiotic medicines, if bacteria caused the pneumonia. Medicines that kill viruses (antiviral medicines), if a virus caused the pneumonia. Oxygen therapy. Severe pneumonia, although rare, may require the following treatments: Mechanical ventilation.This procedure uses a machine to help you breathe if you cannot breathe well on your own or maintain a safe level of blood oxygen. Thoracentesis. This procedure removes any buildup of pleural fluid to help with breathing. Follow these instructions at home:  Medicines Take over-the-counter and prescription medicines only as told by your health care provider. Take cough medicine only if you have trouble sleeping. Cough medicine can prevent your body from removing mucus from your lungs. If you were prescribed antibiotics, take them as told by your health care provider. Do not stop taking the antibiotic even if you start to feel better. Lifestyle     Do not drink alcohol. Do not use any products that contain nicotine or tobacco. These products include cigarettes, chewing tobacco, and vaping devices, such as e-cigarettes. If you need help quitting, ask your health care provider. Eat a healthy diet. This includes plenty of vegetables, fruits, whole grains, low-fat   dairy products, and lean protein. General instructions Rest a lot and  get at least 8 hours of sleep each night. Sleep in a partly upright position at night. Place a few pillows under your head or sleep in a reclining chair. Return to your normal activities as told by your health care provider. Ask your health care provider what activities are safe for you. Drink enough fluid to keep your urine pale yellow. This helps to thin the mucus in your lungs. If your throat is sore, gargle with a mixture of salt and water 3-4 times a day or as needed. To make salt water, completely dissolve -1 tsp (3-6 g) of salt in 1 cup (237 mL) of warm water. Keep all follow-up visits. How is this prevented? You can lower your risk of developing community-acquired pneumonia by: Getting the pneumonia vaccine. There are different types and schedules of pneumonia vaccines. Ask your health care provider which option is best for you. Consider getting the pneumonia vaccine if: You are older than 38 years of age. You are 19-65 years of age and are receiving cancer treatment, have chronic lung disease, or have other medical conditions that affect your immune system. Ask your health care provider if this applies to you. Getting your influenza vaccine every year. Ask your health care provider which type of vaccine is best for you. Getting regular dental checkups. Washing your hands often with soap and water for at least 20 seconds. If soap and water are not available, use hand sanitizer. Contact a health care provider if: You have a fever. You have trouble sleeping because you cannot control your cough with cough medicine. Get help right away if: Your shortness of breath becomes worse. Your chest pain increases. Your sickness becomes worse, especially if you are an older adult or have a weak immune system. You cough up blood. These symptoms may be an emergency. Get help right away. Call 911. Do not wait to see if the symptoms will go away. Do not drive yourself to the  hospital. Summary Pneumonia is an infection of the lungs. Community-acquired pneumonia develops in people who have not been in the hospital. It can be caused by bacteria, viruses, or fungi. This condition may be treated with antibiotics or antiviral medicines. Severe pneumonia may require a hospital stay and treatment to help with breathing. This information is not intended to replace advice given to you by your health care provider. Make sure you discuss any questions you have with your health care provider. Document Revised: 01/28/2022 Document Reviewed: 01/28/2022 Elsevier Patient Education  2023 [employer]  

## 2022-10-02 NOTE — Progress Notes (Signed)
Subjective:    Patient ID: Mandy Garcia, female    DOB: 1986-09-25, 36 y.o.   MRN: [id number]  Chief Complaint  Patient presents with   Follow-up    Pneumonia    PT presents to the office today to follow up on Multifocal pneumonia. She was seen on 08/19/25 and given Levofloxacin,Prednisone, and albuterol. She repeated her chest x-ray today and her pneumonia is not resolved.  Cough This is a recurrent problem. The current episode started 1 to 4 weeks ago. The problem has been unchanged. The cough is Non-productive. Associated symptoms include headaches, nasal congestion and shortness of breath. Pertinent negatives include no chills, ear congestion, ear pain, fever, myalgias, sore throat or wheezing. Risk factors for lung disease include smoking/tobacco exposure. She has tried OTC cough suppressant (levofloxacina nd prednisone) for the symptoms. The treatment provided mild relief.      Review of Systems  Constitutional:  Negative for chills and fever.  HENT:  Negative for ear pain and sore throat.   Respiratory:  Positive for cough and shortness of breath. Negative for wheezing.   Musculoskeletal:  Negative for myalgias.  Neurological:  Positive for headaches.  All other systems reviewed and are negative.      Objective:   Physical Exam Vitals reviewed.  Constitutional:      General: She is not in acute distress.    Appearance: She is well-developed.  HENT:     Head: Normocephalic and atraumatic.  Eyes:     Pupils: Pupils are equal, round, and reactive to light.  Neck:     Thyroid: No thyromegaly.  Cardiovascular:     Rate and Rhythm: Normal rate and regular rhythm.     Heart sounds: Normal heart sounds. No murmur heard. Pulmonary:     Effort: Pulmonary effort is normal. No respiratory distress.     Breath sounds: Rales present. No wheezing.  Abdominal:     General: Bowel sounds are normal. There is no distension.     Palpations: Abdomen is soft.     Tenderness:  There is no abdominal tenderness.  Musculoskeletal:        General: No tenderness. Normal range of motion.     Cervical back: Normal range of motion and neck supple.  Skin:    General: Skin is warm and dry.  Neurological:     Mental Status: She is alert and oriented to person, place, and time.     Cranial Nerves: No cranial nerve deficit.     Deep Tendon Reflexes: Reflexes are normal and symmetric.  Psychiatric:        Behavior: Behavior normal.        Thought Content: Thought content normal.        Judgment: Judgment normal.     BP 114/86   Pulse 77   Temp 98.3 F (36.8 C)   Resp 18   Ht '5\' 2"'$  (1.575 m)   Wt 194 lb (88 kg)   SpO2 94%   BMI 35.48 kg/m        Assessment & Plan:  Mandy Garcia comes in today with chief complaint of Follow-up (Pneumonia )   Diagnosis and orders addressed:  1. Multifocal pneumonia Start Levaquin  Force fluids Tylenol  Needs to repeat chest x-ray 4 weeks, but follow up in 2 weeks if SOB continues. May need CT scan - levofloxacin (LEVAQUIN) 500 MG tablet; Take 1 tablet (500 mg total) by mouth daily for 7 days.  Dispense: 7 tablet; Refill: 0  Evelina Dun, FNP

## 2022-10-12 ENCOUNTER — Other Ambulatory Visit: Payer: Self-pay | Admitting: Family

## 2022-10-12 DIAGNOSIS — J189 Pneumonia, unspecified organism: Secondary | ICD-10-CM

## 2022-10-20 ENCOUNTER — Telehealth: Payer: Self-pay | Admitting: Family

## 2022-10-20 NOTE — Telephone Encounter (Signed)
Please call patient ASAP with update on order for CT Scan.

## 2022-10-22 NOTE — Telephone Encounter (Signed)
Patient's CT is still currently in review with Patient's insurance - Once approved/denied I will contact the Patient.

## 2022-10-22 NOTE — Telephone Encounter (Signed)
Patient said she received a call two days ago from the insurance company telling her they had approved her to have the CT.

## 2022-10-30 ENCOUNTER — Encounter: Payer: Self-pay | Admitting: Family

## 2022-10-30 ENCOUNTER — Ambulatory Visit (INDEPENDENT_AMBULATORY_CARE_PROVIDER_SITE_OTHER): Payer: 59 | Admitting: Family

## 2022-10-30 ENCOUNTER — Other Ambulatory Visit: Payer: Self-pay

## 2022-10-30 ENCOUNTER — Ambulatory Visit
Admission: RE | Admit: 2022-10-30 | Discharge: 2022-10-30 | Disposition: A | Discharge status: Home / Self Care | Payer: 59 | Source: Ambulatory Visit | Attending: Family | Admitting: Family

## 2022-10-30 ENCOUNTER — Ambulatory Visit (INDEPENDENT_AMBULATORY_CARE_PROVIDER_SITE_OTHER): Payer: 59

## 2022-10-30 ENCOUNTER — Encounter (HOSPITAL_COMMUNITY): Payer: Self-pay | Admitting: Hematology and Oncology

## 2022-10-30 VITALS — BP 125/85 | HR 90 | Temp 97.4°F | Ht 62.0 in | Wt 201.2 lb

## 2022-10-30 DIAGNOSIS — J189 Pneumonia, unspecified organism: Secondary | ICD-10-CM | POA: Diagnosis not present

## 2022-10-30 DIAGNOSIS — R911 Solitary pulmonary nodule: Secondary | ICD-10-CM | POA: Diagnosis not present

## 2022-10-30 DIAGNOSIS — J849 Interstitial pulmonary disease, unspecified: Secondary | ICD-10-CM | POA: Diagnosis not present

## 2022-10-30 DIAGNOSIS — J479 Bronchiectasis, uncomplicated: Secondary | ICD-10-CM | POA: Diagnosis not present

## 2022-10-30 DIAGNOSIS — R69 Illness, unspecified: Secondary | ICD-10-CM | POA: Diagnosis not present

## 2022-10-30 DIAGNOSIS — F17200 Nicotine dependence, unspecified, uncomplicated: Secondary | ICD-10-CM

## 2022-10-30 DIAGNOSIS — R918 Other nonspecific abnormal finding of lung field: Secondary | ICD-10-CM | POA: Diagnosis not present

## 2022-10-30 MED ORDER — AZITHROMYCIN 250 MG PO TABS: ORAL_TABLET | ORAL | 0 refills | Status: DC

## 2022-10-30 MED ORDER — AMOXICILLIN-POT CLAVULANATE 875-125 MG PO TABS: 1.0000 | ORAL_TABLET | Freq: Two times a day (BID) | ORAL | 0 refills | Status: DC

## 2022-10-30 NOTE — Patient Instructions (Signed)
Community-Acquired Pneumonia, Adult Pneumonia is a lung infection that causes inflammation and the buildup of mucus and fluids in the lungs. This may cause coughing and difficulty breathing. Community-acquired pneumonia is pneumonia that develops in people who are not, and have not recently been, in a hospital or other health care facility. Usually, pneumonia develops as a result of an illness that is caused by a virus, such as the common cold and the flu (influenza). It can also be caused by bacteria or fungi. While the common cold and influenza can pass from person to person (are contagious), pneumonia itself is not considered contagious. What are the causes? This condition may be caused by: Viruses. Bacteria. Fungi. What increases the risk? The following factors may make you more likely to develop this condition: Being over age 65 or having certain medical conditions, such as: A long-term (chronic) disease, such as: chronic obstructive pulmonary disease (COPD), asthma, heart failure, diabetes, or kidney disease. A condition that increases the risk of breathing in (aspirating) mucus and other fluids from your mouth and nose. A weakened body defense system (immune system). Having had your spleen removed (splenectomy). The spleen is the organ that helps fight germs and infections. Not cleaning your teeth and gums well (poor dental hygiene). Using tobacco products. Traveling to places where germs that cause pneumonia are present or being near certain animals or animal habitats that could have germs that cause pneumonia. What are the signs or symptoms? Symptoms of this condition include: A dry cough or a wet (productive) cough. A fever, sweating, or chills. Chest pain, especially when breathing deeply or coughing. Fast breathing, difficulty breathing, or shortness of breath. Tiredness (fatigue) and muscle aches. How is this diagnosed? This condition may be diagnosed based on your medical  history or a physical exam. You may also have tests, including: Imaging, such as a chest X-ray or lung ultrasound. Tests of: The level of oxygen and other gases in your blood. Mucus from your lungs (sputum). Fluid around your lungs (pleural fluid). Your urine. How is this treated? Treatment for this condition depends on many factors, such as the cause of your pneumonia, your medicines, and other medical conditions that you have. For most adults, pneumonia may be treated at home. In some cases, treatment must happen in a hospital and may include: Medicines that are given by mouth (orally) or through an IV, including: Antibiotic medicines, if bacteria caused the pneumonia. Medicines that kill viruses (antiviral medicines), if a virus caused the pneumonia. Oxygen therapy. Severe pneumonia, although rare, may require the following treatments: Mechanical ventilation.This procedure uses a machine to help you breathe if you cannot breathe well on your own or maintain a safe level of blood oxygen. Thoracentesis. This procedure removes any buildup of pleural fluid to help with breathing. Follow these instructions at home:  Medicines Take over-the-counter and prescription medicines only as told by your health care provider. Take cough medicine only if you have trouble sleeping. Cough medicine can prevent your body from removing mucus from your lungs. If you were prescribed antibiotics, take them as told by your health care provider. Do not stop taking the antibiotic even if you start to feel better. Lifestyle     Do not drink alcohol. Do not use any products that contain nicotine or tobacco. These products include cigarettes, chewing tobacco, and vaping devices, such as e-cigarettes. If you need help quitting, ask your health care provider. Eat a healthy diet. This includes plenty of vegetables, fruits, whole grains, low-fat   dairy products, and lean protein. General instructions Rest a lot and  get at least 8 hours of sleep each night. Sleep in a partly upright position at night. Place a few pillows under your head or sleep in a reclining chair. Return to your normal activities as told by your health care provider. Ask your health care provider what activities are safe for you. Drink enough fluid to keep your urine pale yellow. This helps to thin the mucus in your lungs. If your throat is sore, gargle with a mixture of salt and water 3-4 times a day or as needed. To make salt water, completely dissolve -1 tsp (3-6 g) of salt in 1 cup (237 mL) of warm water. Keep all follow-up visits. How is this prevented? You can lower your risk of developing community-acquired pneumonia by: Getting the pneumonia vaccine. There are different types and schedules of pneumonia vaccines. Ask your health care provider which option is best for you. Consider getting the pneumonia vaccine if: You are older than 38 years of age. You are 19-65 years of age and are receiving cancer treatment, have chronic lung disease, or have other medical conditions that affect your immune system. Ask your health care provider if this applies to you. Getting your influenza vaccine every year. Ask your health care provider which type of vaccine is best for you. Getting regular dental checkups. Washing your hands often with soap and water for at least 20 seconds. If soap and water are not available, use hand sanitizer. Contact a health care provider if: You have a fever. You have trouble sleeping because you cannot control your cough with cough medicine. Get help right away if: Your shortness of breath becomes worse. Your chest pain increases. Your sickness becomes worse, especially if you are an older adult or have a weak immune system. You cough up blood. These symptoms may be an emergency. Get help right away. Call 911. Do not wait to see if the symptoms will go away. Do not drive yourself to the  hospital. Summary Pneumonia is an infection of the lungs. Community-acquired pneumonia develops in people who have not been in the hospital. It can be caused by bacteria, viruses, or fungi. This condition may be treated with antibiotics or antiviral medicines. Severe pneumonia may require a hospital stay and treatment to help with breathing. This information is not intended to replace advice given to you by your health care provider. Make sure you discuss any questions you have with your health care provider. Document Revised: 01/28/2022 Document Reviewed: 01/28/2022 Elsevier Patient Education  2023 [employer]  

## 2022-10-30 NOTE — Progress Notes (Signed)
Subjective:    Patient ID: Mandy Garcia, female    DOB: 08-Dec-1986, 36 y.o.   MRN: [id number]  Chief Complaint  Patient presents with   Pneumonia    4 week follow up    PT presents to the office today to follow up on multifocal. She was seen on 08/19/25 and given Levofloxacin,Prednisone, and albuterol. She repeated her chest x-ray 10/02/22 that showed, "Slight improvement in bibasilar mixed interstitial and airspace opacities. Recommend CT scan". This was ordered, but having a problem with her insurance covering. She was given levofloxacin 500 mg .  She has completed this. She continues to have SOB, but states this has improved.  Pneumonia She complains of chest tightness, cough, difficulty breathing, frequent throat clearing, shortness of breath and wheezing. There is no sputum production. This is a recurrent problem. The current episode started more than 1 month ago. The problem occurs intermittently. The cough is non-productive. Associated symptoms include malaise/fatigue, nasal congestion and sneezing. Pertinent negatives include no ear congestion, ear pain, fever, headaches, rhinorrhea or sore throat. Her past medical history is significant for pneumonia.  Nicotine Dependence Presents for follow-up visit. Symptoms are negative for sore throat. Her urge triggers include company of smokers. The symptoms have been stable. She smokes < 1/2 a pack of cigarettes per day.      Review of Systems  Constitutional:  Positive for malaise/fatigue. Negative for fever.  HENT:  Positive for sneezing. Negative for ear pain, rhinorrhea and sore throat.   Respiratory:  Positive for cough, shortness of breath and wheezing. Negative for sputum production.   Neurological:  Negative for headaches.  All other systems reviewed and are negative.      Objective:   Physical Exam Vitals reviewed.  Constitutional:      General: She is not in acute distress.    Appearance: She is well-developed. She  is obese.  HENT:     Head: Normocephalic and atraumatic.     Right Ear: Tympanic membrane normal.     Left Ear: Tympanic membrane normal.  Eyes:     Pupils: Pupils are equal, round, and reactive to light.  Neck:     Thyroid: No thyromegaly.  Cardiovascular:     Rate and Rhythm: Normal rate and regular rhythm.     Heart sounds: Normal heart sounds. No murmur heard. Pulmonary:     Effort: Pulmonary effort is normal. No respiratory distress.     Breath sounds: Decreased air movement present. Decreased breath sounds, wheezing and rhonchi present.  Abdominal:     General: Bowel sounds are normal. There is no distension.     Palpations: Abdomen is soft.     Tenderness: There is no abdominal tenderness.  Musculoskeletal:        General: No tenderness. Normal range of motion.     Cervical back: Normal range of motion and neck supple.  Skin:    General: Skin is warm and dry.  Neurological:     Mental Status: She is alert and oriented to person, place, and time.     Cranial Nerves: No cranial nerve deficit.     Deep Tendon Reflexes: Reflexes are normal and symmetric.  Psychiatric:        Behavior: Behavior normal.        Thought Content: Thought content normal.        Judgment: Judgment normal.       BP 125/85   Pulse 90   Temp (!) 97.4 F (36.3 C) (Temporal)  Ht _0  (1.575 m)   Wt 201 lb 3.2 oz (91.3 kg)   SpO2 93%   BMI 36.80 kg/m      Assessment & Plan:  Mandy Garcia comes in today with chief complaint of Pneumonia (4 week follow up )   Diagnosis and orders addressed:  1. Community acquired pneumonia, unspecified laterality - DG Chest 2 View; Future - CMP14+EGFR - CBC with Differential/Platelet - CT Chest Wo Contrast; Future  2. Multifocal pneumonia - CMP14+EGFR - CBC with Differential/Platelet - amoxicillin-clavulanate (AUGMENTIN) 875-125 MG tablet; Take 1 tablet by mouth 2 (two) times daily.  Dispense: 14 tablet; Refill: 0 - azithromycin  (ZITHROMAX) 250 MG tablet; Take 500 mg once, then 250 mg for four days  Dispense: 6 tablet; Refill: 0 - CT Chest Wo Contrast; Future  3. Current smoker - CMP14+EGFR - CBC with Differential/Platelet - CT Chest Wo Contrast; Future   Labs pending Health Maintenance reviewed Diet and exercise encouraged  Follow up plan: Stat CT ordered today  Start Augmentin and Zpak   Evelina Dun, FNP

## 2022-10-31 LAB — CMP14+EGFR
ALT: 16 IU/L (ref 0–32)
AST: 16 IU/L (ref 0–40)
Albumin/Globulin Ratio: 2 (ref 1.2–2.2)
Albumin: 4.3 g/dL (ref 3.9–4.9)
Alkaline Phosphatase: 82 IU/L (ref 44–121)
BUN/Creatinine Ratio: 7 — ABNORMAL LOW (ref 9–23)
BUN: 5 mg/dL — ABNORMAL LOW (ref 6–20)
Bilirubin Total: 0.3 mg/dL (ref 0.0–1.2)
CO2: 23 mmol/L (ref 20–29)
Calcium: 9.3 mg/dL (ref 8.7–10.2)
Chloride: 102 mmol/L (ref 96–106)
Creatinine, Ser: 0.72 mg/dL (ref 0.57–1.00)
Globulin, Total: 2.2 g/dL (ref 1.5–4.5)
Glucose: 85 mg/dL (ref 70–99)
Potassium: 4.3 mmol/L (ref 3.5–5.2)
Sodium: 139 mmol/L (ref 134–144)
Total Protein: 6.5 g/dL (ref 6.0–8.5)
eGFR: 110 mL/min/{1.73_m2} (ref >59)

## 2022-10-31 LAB — CBC WITH DIFFERENTIAL/PLATELET
Basophils Absolute: 0.1 10*3/uL (ref 0.0–0.2)
Basos: 1 %
EOS (ABSOLUTE): 0.1 10*3/uL (ref 0.0–0.4)
Eos: 1 %
Hematocrit: 46.4 % (ref 34.0–46.6)
Hemoglobin: 15.1 g/dL (ref 11.1–15.9)
Immature Grans (Abs): 0 10*3/uL (ref 0.0–0.1)
Immature Granulocytes: 0 %
Lymphocytes Absolute: 1.3 10*3/uL (ref 0.7–3.1)
Lymphs: 15 %
MCH: 28.6 pg (ref 26.6–33.0)
MCHC: 32.5 g/dL (ref 31.5–35.7)
MCV: 88 fL (ref 79–97)
Monocytes Absolute: 0.7 10*3/uL (ref 0.1–0.9)
Monocytes: 9 %
Neutrophils Absolute: 6.3 10*3/uL (ref 1.4–7.0)
Neutrophils: 74 %
Platelets: 270 10*3/uL (ref 150–450)
RBC: 5.28 x10E6/uL (ref 3.77–5.28)
RDW: 13.3 % (ref 11.7–15.4)
WBC: 8.6 10*3/uL (ref 3.4–10.8)

## 2022-11-02 ENCOUNTER — Other Ambulatory Visit: Payer: Self-pay | Admitting: Family

## 2022-11-02 DIAGNOSIS — R911 Solitary pulmonary nodule: Secondary | ICD-10-CM

## 2022-11-02 DIAGNOSIS — J8417 Interstitial lung disease with progressive fibrotic phenotype in diseases classified elsewhere: Secondary | ICD-10-CM

## 2022-12-02 ENCOUNTER — Other Ambulatory Visit: Payer: Self-pay

## 2022-12-15 ENCOUNTER — Ambulatory Visit: Payer: 59 | Admitting: Internal Medicine

## 2022-12-15 ENCOUNTER — Encounter: Payer: Self-pay | Admitting: Internal Medicine

## 2022-12-15 VITALS — BP 118/72 | HR 76 | Temp 98.2°F | Ht 62.0 in | Wt 204.0 lb

## 2022-12-15 DIAGNOSIS — R058 Other specified cough: Secondary | ICD-10-CM | POA: Diagnosis not present

## 2022-12-15 DIAGNOSIS — F17210 Nicotine dependence, cigarettes, uncomplicated: Secondary | ICD-10-CM

## 2022-12-15 DIAGNOSIS — J479 Bronchiectasis, uncomplicated: Secondary | ICD-10-CM | POA: Insufficient documentation

## 2022-12-15 DIAGNOSIS — R69 Illness, unspecified: Secondary | ICD-10-CM | POA: Diagnosis not present

## 2022-12-15 MED ORDER — FAMOTIDINE 20 MG PO TABS: ORAL_TABLET | ORAL | 11 refills | Status: DC

## 2022-12-15 MED ORDER — PREDNISONE 10 MG PO TABS: ORAL_TABLET | ORAL | 0 refills | Status: DC

## 2022-12-15 MED ORDER — PANTOPRAZOLE SODIUM 40 MG PO TBEC: 40.0000 mg | DELAYED_RELEASE_TABLET | Freq: Every day | ORAL | 2 refills | Status: DC

## 2022-12-15 MED ORDER — AMOXICILLIN-POT CLAVULANATE 875-125 MG PO TABS: 1.0000 | ORAL_TABLET | Freq: Two times a day (BID) | ORAL | 0 refills | Status: DC

## 2022-12-15 NOTE — Assessment & Plan Note (Signed)
Onset of symptoms p "tyical URI " in Sept 2023 - Labs ordered 12/15/2022  :  allergy profile   alpha one AT phenotype    Rx for now = gerd rx/ diet and prn saba or can tr breztri samples - see avs for instructions unique to this ov

## 2022-12-15 NOTE — Assessment & Plan Note (Signed)
Onset 08/2022 in active smoker with prominent pseudowheeze / bronchiecatasis on CT 10/30/22  - .12/15/2022  rec augmentin x 10 da and f/u with sinus ct if not better - 12/15/2022 rec max rx for GERD / pred x 6 days and f/u in 6 weeks   Upper airway cough syndrome (previously labeled PNDS),  is so named because it's frequently impossible to sort out how much is  CR/sinusitis with freq throat clearing (which can be related to primary GERD)   vs  causing  secondary (" extra esophageal")  GERD from wide swings in gastric pressure that occur with throat clearing, often  promoting self use of mint and menthol lozenges that reduce the lower esophageal sphincter tone and exacerbate the problem further in a cyclical fashion.   These are the same pts (now being labeled as having "irritable larynx syndrome" by some cough centers) who not infrequently have a history of having failed to tolerate ace inhibitors,  dry powder inhalers or biphosphonates or report having atypical/extraesophageal reflux symptoms that don't respond to standard doses of PPI  and are easily confused as having aecopd or asthma flares by even experienced allergists/ pulmonologists (myself included).   Recs as above and f/u in 6 weeks with all meds in hand using a trust but verify approach to confirm accurate Medication  Reconciliation The principal here is that until we are certain that the  patients are doing what we've asked, it makes no sense to ask them to do more.

## 2022-12-15 NOTE — Patient Instructions (Addendum)
See Dentist as soon as you can - this is really important for your lung health.  For cough > mucinex dm 1200 mg one every 12 hours as needed (OTC)   Prednisone 10 mg take  4 each am x 2 days,   2 each am x 2 days,  1 each am x 2 days and stop   Augmentin 875 mg take one pill twice daily  X 10 days - take at breakfast and supper with large glass of water.  It would help reduce the usual side effects (diarrhea and yeast infections) if you ate cultured yogurt at lunch.    For shortness > breztri 1-2 every hours or albuterol 2 puffs every 4 hours as needed     Ok to try albuterol or breztri 15-30 min before an activity (on alternating days)  that you know would usually make you short of breath and see if it makes any difference and if makes none then don't take albuterol after activity unless you can't catch your breath as this means it's the resting that helps, not the albuterol.  Work on inhaler technique:  relax and gently blow all the way out then take a nice smooth full deep breath back in, triggering the inhaler at same time you start breathing in.  Hold breath in for at least  5 seconds if you can. Blow out breztri  thru nose. Rinse and gargle with water when done.  If mouth or throat bother you at all,  try brushing teeth/gums/tongue with arm and hammer toothpaste/ make a slurry and gargle and spit out.    Pantoprazole (protonix) 40 mg   Take  30-60 min before first meal of the day and Pepcid (famotidine)  20 mg after supper until return to office - this is the best way to tell whether stomach acid is contributing to your problem.   GERD (REFLUX)  is an extremely common cause of respiratory symptoms just like yours , many times with no obvious heartburn at all.    It can be treated with medication, but also with lifestyle changes including elevation of the head of your bed (ideally with 6 -8inch blocks under the headboard of your bed),  Smoking cessation, avoidance of late meals, excessive  alcohol, and avoid fatty foods, chocolate, peppermint, colas, red wine, and acidic juices such as orange juice.  NO MINT OR MENTHOL PRODUCTS SO NO COUGH DROPS  USE SUGARLESS CANDY INSTEAD (Jolley ranchers or Stover's or Life Savers) or even ice chips will also do - the key is to swallow to prevent all throat clearing. NO OIL BASED VITAMINS - use powdered substitutes.  Avoid fish oil when coughing.    Please remember to go to the lab department   for your tests - we will call you with the results when they are available.       Please schedule a follow up office visit in 6 weeks, call sooner if needed with all medications /inhalers/ solutions in hand so we can verify exactly what you are taking. This includes all medications from all doctors and over the counters

## 2022-12-15 NOTE — Progress Notes (Signed)
Mandy Garcia, female    DOB: [birth year]    MRN: [id number]   Brief patient profile:  37  yowf  active smoker with bad asthma as child outgrew completely  by age 37 able to do sports s inhalers  referred to pulmonary clinic in [place]  12/15/2022 by Evelina Dun  for sob/ cough onset 08/2022  abruptly with "typical URI for her"  but did not resolve despite multiple antibiotics and prednisone .   History of Present Illness  12/15/2022  Pulmonary/ 1st office eval/ [facility] P[facility] / [place] [facility]  Chief Complaint  Patient presents with   Consult    SOB and cough ILD/ pulm nodules   Dyspnea:  walking  75 yards to car/ flat  Cough: worse after supper which is her biggest meal / worst at hs then sleeps ok  Light green mucus  Sleep: flat bed/ 2 pillows  SABA use: not covinced it helps 02: none   No obvious day to day or daytime pattern/variability or assoc excess/ purulent sputum or mucus plugs or hemoptysis or cp or chest tightness, subjective wheeze or overt sinus or hb symptoms.    Also denies any obvious fluctuation of symptoms with weather or environmental changes or other aggravating or alleviating factors except as outlined above   No unusual exposure hx or h/o childhood pna  or knowledge of premature birth.  Current Allergies, Complete Past Medical History, Past Surgical History, Family History, and Social History were reviewed in [employer] record.  ROS  The following are not active complaints unless bolded Hoarseness, sore throat, dysphagia, dental problems, itching, sneezing,  nasal congestion or discharge of excess mucus or purulent secretions, ear ache,   fever, chills, sweats, unintended wt loss or wt gain, classically pleuritic or exertional cp,  orthopnea pnd or arm/hand swelling  or leg swelling, presyncope, palpitations, abdominal pain, anorexia, nausea, vomiting, diarrhea  or change in bowel habits or change in bladder habits, change in stools or  change in urine, dysuria, hematuria,  rash, arthralgias, visual complaints, headache, numbness, weakness or ataxia or problems with walking or coordination,  change in mood or  memory.             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 30 tablet 0   albuterol (VENTOLIN HFA) 108 (90 Base) MCG/ACT inhaler Inhale 2 puffs into the lungs every 6 (six) hours as needed for wheezing or shortness of breath. 8 g 0   ferrous sulfate 325 (65 FE) MG EC tablet Take 325 mg by mouth 3 (three) times daily with meals.     hydrOXYzine (ATARAX) 25 MG tablet Take 1 tablet (25 mg total) by mouth every 8 (eight) hours as needed. 90 tablet 2   linaclotide (LINZESS) 145 MCG CAPS capsule Take 1 capsule (145 mcg total) by mouth daily before breakfast. 90 capsule 1   triamcinolone ointment (KENALOG) 0.5 % Apply 1 Application topically 2 (two) times daily. 60 g 2       0      0       Objective:     BP 118/72   Pulse  76   Temp 98.2 F (36.8 C)   Ht '5\' 2"'$  (1.575 m)   Wt 204 lb (92.5 kg)   SpO2 97% Comment: ra  BMI 37.31 kg/m   SpO2: 97 % (ra)  Amb wf slt rattle / prominent upper airway pseudowheeze   HEENT : Oropharynx  clear/   poor dentition    NECK :  without  apparent JVD/ palpable Nodes/TM    LUNGS: no acc muscle use,  Min barrel  contour chest wall with bilateral  slightly decreased bs s audible wheeze and  without cough on insp or exp maneuvers and min  Hyperresonant  to  percussion bilaterally    CV:  RRR  no s3 or murmur or increase in P2, and no edema   ABD:  soft and nontender with pos end  insp Hoover's  in the supine position.  No bruits or organomegaly appreciated   MS:  Nl gait/ ext warm without deformities  Or obvious joint restrictions  calf tenderness, cyanosis or clubbing     SKIN: warm and dry without lesions    NEURO:  alert, approp, nl sensorium with  no motor or cerebellar deficits apparent.         I personally reviewed images and agree with radiology impression as follows:   Chest CT 10/30/22    w/o contrast    1. Bilateral peribronchovascular and lower lung predominant reticular and ground-glass opacities with associated traction bronchiectasis, but no honeycomb change. 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 2. Mildly enlarged mediastinal and bilateral hilar lymph nodes, likely reactive. 3. Scattered small solid pulmonary nodules, largest is a 3 mm nodule of the right upper lobe. No follow-up needed if patient is low-risk (and has no known or suspected primary neoplasm). Non-contrast chest CT can be considered in 12 months if patient is high-risk.      Assessment   Upper airway cough syndrome Onset 08/2022 in active smoker with prominent pseudowheeze / bronchiecatasis on CT 10/30/22  - .12/15/2022  rec augmentin x 10 da and f/u with sinus ct if not better - 12/15/2022 rec max rx for GERD / pred x 6 days and f/u in 6 weeks   Upper airway cough syndrome (previously labeled PNDS),  is so named because it's frequently impossible to sort out how much is  CR/sinusitis with freq throat clearing (which can be related to primary GERD)   vs  causing  secondary (" extra esophageal")  GERD from wide swings in gastric pressure that occur with throat clearing, often  promoting self use of mint and menthol lozenges that reduce the lower esophageal sphincter tone and exacerbate the problem further in a cyclical fashion.   These are the same pts (now being labeled as having "irritable larynx syndrome" by some cough centers) who not infrequently have a history of having failed to tolerate ace inhibitors,  dry powder inhalers or biphosphonates or report  having atypical/extraesophageal reflux symptoms that don't respond to standard doses of PPI  and are easily confused as having aecopd or asthma flares by even experienced allergists/ pulmonologists (myself included).   Recs as above and f/u in 6 weeks with all meds in hand using a trust but verify approach to confirm accurate Medication  Reconciliation The principal here is that until we are certain that the  patients are doing what we've asked, it makes no sense to ask them to do more.   Bronchiectasis without complication (HCC) Onset of symptoms  p "tyical URI " in Sept 2023 - Labs ordered 12/15/2022  :  allergy profile   alpha one AT phenotype    Rx for now = gerd rx/ diet and prn saba or can tr breztri samples - see avs for instructions unique to this ov     Cigarette smoker Counseled re importance of smoking cessation but did not meet time criteria for separate billing           Each maintenance medication was reviewed in detail including emphasizing most importantly the difference between maintenance and prns and under what circumstances the prns are to be triggered using an action plan format where appropriate.  Total time for H and P, chart review, counseling, reviewing hfa device(s) and generating customized AVS unique to this office visit / same day charting  > 60 min in pt new to me          Christinia Gully, MD 12/15/2022

## 2022-12-15 NOTE — Assessment & Plan Note (Signed)
Counseled re importance of smoking cessation but did not meet time criteria for separate billing           Each maintenance medication was reviewed in detail including emphasizing most importantly the difference between maintenance and prns and under what circumstances the prns are to be triggered using an action plan format where appropriate.  Total time for H and P, chart review, counseling, reviewing hfa device(s) and generating customized AVS unique to this office visit / same day charting  > 60 min in pt new to me

## 2022-12-18 LAB — CBC WITH DIFFERENTIAL/PLATELET
Basophils Absolute: 0.1 10*3/uL (ref 0.0–0.2)
Basos: 1 %
EOS (ABSOLUTE): 0.1 10*3/uL (ref 0.0–0.4)
Eos: 1 %
Hematocrit: 47.4 % — ABNORMAL HIGH (ref 34.0–46.6)
Hemoglobin: 15.5 g/dL (ref 11.1–15.9)
Immature Grans (Abs): 0 10*3/uL (ref 0.0–0.1)
Immature Granulocytes: 0 %
Lymphocytes Absolute: 1.7 10*3/uL (ref 0.7–3.1)
Lymphs: 18 %
MCH: 28.5 pg (ref 26.6–33.0)
MCHC: 32.7 g/dL (ref 31.5–35.7)
MCV: 87 fL (ref 79–97)
Monocytes Absolute: 0.9 10*3/uL (ref 0.1–0.9)
Monocytes: 10 %
Neutrophils Absolute: 6.8 10*3/uL (ref 1.4–7.0)
Neutrophils: 70 %
Platelets: 341 10*3/uL (ref 150–450)
RBC: 5.44 x10E6/uL — ABNORMAL HIGH (ref 3.77–5.28)
RDW: 13.8 % (ref 11.7–15.4)
WBC: 9.5 10*3/uL (ref 3.4–10.8)

## 2022-12-18 LAB — IGE: IgE (Immunoglobulin E), Serum: <2 IU/mL — ABNORMAL LOW (ref 6–495)

## 2022-12-18 LAB — ALPHA-1-ANTITRYPSIN PHENOTYP: A-1 Antitrypsin: 156 mg/dL (ref 100–188)

## 2023-01-22 ENCOUNTER — Encounter (HOSPITAL_COMMUNITY): Payer: Self-pay | Admitting: Hematology and Oncology

## 2023-01-28 NOTE — Progress Notes (Signed)
Jyl Reifer, female    DOB: Aug 28, 1986    MRN: [id number]   Brief patient profile:  37 yowf  active smoker/MM with bad asthma as child outgrew completely  by age 37 able to do sports s inhalers  referred to pulmonary clinic in [place]  12/15/2022 by Evelina Dun  for sob/ cough onset 08/2022  abruptly with "typical URI for her"  but did not resolve despite multiple antibiotics and prednisone .   History of Present Illness  12/15/2022  Pulmonary/ 1st office eval/ Delano Scardino / [place] [facility]  Chief Complaint  Patient presents with   Consult    SOB and cough ILD/ pulm nodules   Dyspnea:  walking  75 yards to car/ flat  Cough: worse after supper which is her biggest meal / worst at hs then sleeps ok  Light green mucus  Sleep: flat bed/ 2 pillows  SABA use: not covinced it helps 02: none  Rec See Dentist as soon as you can - this is really important for your lung health. For cough > mucinex dm 1200 mg one every 12 hours as needed (OTC)  Prednisone 10 mg take  4 each am x 2 days,   2 each am x 2 days,  1 each am x 2 days and stop  Augmentin 875 mg take one pill twice daily  X 10 days -  For shortness > breztri 1-2 every hours or albuterol 2 puffs every 4 hours as needed Ok to try albuterol or breztri 15-30 min before an activity (on alternating days)  that you know would usually make you short of breath  Work on inhaler technique:   Pantoprazole (protonix) 40 mg   Take  30-60 min before first meal of the day and Pepcid (famotidine)  20 mg after supper until return to office GERD rx Please schedule a follow up office visit in 6 weeks, call sooner if needed with all medications /inhalers/ solutions in hand so we can verify exactly what you are taking. This includes all medications from all doctors and over the counters    01/29/2023  f/u ov/[place] office/Kahlia Lagunes re: UACS/ bronchiectasis   Chief Complaint  Patient presents with   Follow-up    Pt f/u she states the she is feeling  better still has a slight cough and some SOB but it has gotten better   Dyspnea:  across parking lot better on breztri/ worse when ran out Cough: light green  Sleeping: level bed / 2 pillows does fine  SABA use: not using at all  02: none  Covid status: none / infected 2020      No obvious day to day or daytime variability or assoc  mucus plugs or hemoptysis or cp or chest tightness, subjective wheeze or overt sinus or hb symptoms.   Sleeping  without nocturnal  or early am exacerbation  of respiratory  c/o's or need for noct saba. Also denies any obvious fluctuation of symptoms with weather or environmental changes or other aggravating or alleviating factors except as outlined above   No unusual exposure hx or h/o childhood pna  or knowledge of premature birth.  Current Allergies, Complete Past Medical History, Past Surgical History, Family History, and Social History were reviewed in [employer] record.  ROS  The following are not active complaints unless bolded Hoarseness, sore throat, dysphagia, dental problems, itching, sneezing,  nasal congestion or discharge of excess mucus or purulent secretions, ear ache,   fever, chills, sweats, unintended  wt loss or wt gain, classically pleuritic or exertional cp,  orthopnea pnd or arm/hand swelling  or leg swelling, presyncope, palpitations, abdominal pain, anorexia, nausea, vomiting, diarrhea  or change in bowel habits or change in bladder habits, change in stools or change in urine, dysuria, hematuria,  rash, arthralgias, visual complaints, headache, numbness, weakness or ataxia or problems with walking or coordination,  change in mood or  memory.        Current Meds  Medication Sig   acetaminophen (TYLENOL) 500 MG tablet Take 1 tablet (500 mg total) by mouth every 6 (six) hours as needed.   albuterol (VENTOLIN HFA) 108 (90 Base) MCG/ACT inhaler Inhale 2 puffs into the lungs every 6 (six) hours as needed for wheezing or  shortness of breath.   famotidine (PEPCID) 20 MG tablet One after supper   ferrous sulfate 325 (65 FE) MG EC tablet Take 325 mg by mouth 3 (three) times daily with meals.   hydrOXYzine (ATARAX) 25 MG tablet Take 1 tablet (25 mg total) by mouth every 8 (eight) hours as needed.   linaclotide (LINZESS) 145 MCG CAPS capsule Take 1 capsule (145 mcg total) by mouth daily before breakfast.   triamcinolone ointment (KENALOG) 0.5 % Apply 1 Application topically 2 (two) times daily.   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    Mental disorder    Shoulder pain         Objective:    Wts    01/29/2023        194  12/15/22 204 lb (92.5 kg)  10/30/22 201 lb 3.2 oz (91.3 kg)  10/02/22 194 lb (88 kg)      Vital signs reviewed  01/29/2023  - Note at rest 02 sats  97% on RA   General appearance:    amb wf/ occ throat clearing/ mild pseudowheezing     HEENT : Oropharynx  clear         NECK :  without  apparent JVD/ palpable Nodes/TM    LUNGS: no acc muscle use,  Nl contour chest which is clear to A and P bilaterally without cough on insp or exp maneuvers   CV:  RRR  no s3 or murmur or increase in P2, and no edema   ABD:  soft and nontender with nl inspiratory excursion in the supine position. No bruits or organomegaly appreciated   MS:  Nl gait/ ext warm without deformities Or obvious joint restrictions  calf tenderness, cyanosis or clubbing    SKIN: warm and dry without lesions    NEURO:  alert, approp, nl sensorium with  no motor or cerebellar deficits apparent.              Assessment

## 2023-01-29 ENCOUNTER — Encounter: Payer: Self-pay | Admitting: Internal Medicine

## 2023-01-29 ENCOUNTER — Ambulatory Visit: Payer: 59 | Admitting: Internal Medicine

## 2023-01-29 VITALS — BP 126/80 | HR 82 | Ht 62.0 in | Wt 194.5 lb

## 2023-01-29 DIAGNOSIS — J479 Bronchiectasis, uncomplicated: Secondary | ICD-10-CM

## 2023-01-29 DIAGNOSIS — F17210 Nicotine dependence, cigarettes, uncomplicated: Secondary | ICD-10-CM

## 2023-01-29 DIAGNOSIS — R69 Illness, unspecified: Secondary | ICD-10-CM | POA: Diagnosis not present

## 2023-01-29 DIAGNOSIS — R058 Other specified cough: Secondary | ICD-10-CM

## 2023-01-29 MED ORDER — FAMOTIDINE 20 MG PO TABS: ORAL_TABLET | ORAL | 11 refills | Status: DC

## 2023-01-29 MED ORDER — BREZTRI AEROSPHERE 160-9-4.8 MCG/ACT IN AERO: 2.0000 | INHALATION_SPRAY | Freq: Two times a day (BID) | RESPIRATORY_TRACT | 0 refills | Status: DC

## 2023-01-29 MED ORDER — BREZTRI AEROSPHERE 160-9-4.8 MCG/ACT IN AERO: 2.0000 | INHALATION_SPRAY | Freq: Two times a day (BID) | RESPIRATORY_TRACT | 11 refills | Status: DC

## 2023-01-29 NOTE — Assessment & Plan Note (Signed)
Onset of symptoms p "tyical URI " in Sept 2023 -  12/15/2022  :  allergy screen Eos 0.1  IgE < 2  alpha one AT phenotype  MM level 156  - 01/29/2023  After extensive coaching inhaler device,  effectiveness =   75% (short ti) continue breztri 1-2 bid   Much better but still some cough/ pseudowheeze so rec  Max acid suppression  with pepcid 20 mg bid since can't tol ppi  Max gerd instruction to include bed blocks  Continue breztri for now 1-2 every 12 hours (lower dose preferred in pt who may also have uacs  F/u in 3 m with pfts, call sooner if needed

## 2023-01-29 NOTE — Assessment & Plan Note (Signed)
Counseled re importance of smoking cessation but did not meet time criteria for separate billing           Each maintenance medication was reviewed in detail including emphasizing most importantly the difference between maintenance and prns and under what circumstances the prns are to be triggered using an action plan format where appropriate.  Total time for H and P, chart review, counseling, reviewing hfa device(s) and generating customized AVS unique to this office visit / same day charting > 30 min for multiple  refractory respiratory  symptoms of uncertain etiology

## 2023-01-29 NOTE — Patient Instructions (Signed)
Change pepcid to 20  mg take after breakfast and after supper   For breakthru heartburn take Maalox plus   Try Breztri one twice daily   GERD (REFLUX)  is an extremely common cause of respiratory symptoms just like yours , many times with no obvious heartburn at all.    It can be treated with medication, but also with lifestyle changes including elevation of the head of your bed (ideally with 6 -8inch blocks under the headboard of your bed),  Smoking cessation, avoidance of late meals, excessive alcohol, and avoid fatty foods, chocolate, peppermint, colas, red wine, and acidic juices such as orange juice.  NO MINT OR MENTHOL PRODUCTS SO NO COUGH DROPS  USE SUGARLESS CANDY INSTEAD (Jolley ranchers or Stover's or Life Savers) or even ice chips will also do - the key is to swallow to prevent all throat clearing. NO OIL BASED VITAMINS - use powdered substitutes.  Avoid fish oil when coughing.  Please schedule a follow up visit in 3 months but call sooner if needed with PFTs hopefully same day

## 2023-01-29 NOTE — Assessment & Plan Note (Signed)
Onset 08/2022 in active smoker with prominent pseudowheeze / bronchiecatasis on CT 10/30/22  -  12/15/2022  rec augmentin x 10 da and f/u with sinus ct if not better -  12/15/2022  :  allergy screen Eos 0.1  IgE < 2  - 12/15/2022 rec max rx for GERD / pred x 6 days and f/u in 6 weeks   Could not tol ppi > continue h2 but give bid pc

## 2023-05-11 ENCOUNTER — Other Ambulatory Visit: Payer: Self-pay

## 2023-05-11 DIAGNOSIS — F17210 Nicotine dependence, cigarettes, uncomplicated: Secondary | ICD-10-CM

## 2023-05-11 DIAGNOSIS — J479 Bronchiectasis, uncomplicated: Secondary | ICD-10-CM

## 2023-05-11 DIAGNOSIS — R058 Other specified cough: Secondary | ICD-10-CM

## 2023-05-11 NOTE — Progress Notes (Unsigned)
Mandy Garcia, female    DOB: 16-Aug-1986    MRN: [id number]   Brief patient profile:  37 yowf  active smoker/MM with bad asthma as child outgrew completely  by age 37 able to do sports s inhalers  referred to pulmonary clinic in [place]  12/15/2022 by [name]  for sob/ cough onset 08/2022  abruptly with "typical URI for her"  but did not resolve despite multiple antibiotics and prednisone .   History of Present Illness  12/15/2022  Pulmonary/ 1st office eval/ Mandy Garcia / [place] [facility]  Chief Complaint  Patient presents with   Consult    SOB and cough ILD/ pulm nodules   Dyspnea:  walking  75 yards to car/ flat  Cough: worse after supper which is her biggest meal / worst at hs then sleeps ok  Light green mucus  Sleep: flat bed/ 2 pillows  SABA use: not covinced it helps 02: none  Rec See Dentist as soon as you can - this is really important for your lung health. For cough > mucinex dm 1200 mg one every 12 hours as needed (OTC)  Prednisone 10 mg take  4 each am x 2 days,   2 each am x 2 days,  1 each am x 2 days and stop  Augmentin 875 mg take one pill twice daily  X 10 days -  For shortness > breztri 1-2 every hours or albuterol 2 puffs every 4 hours as needed Ok to try albuterol or breztri 15-30 min before an activity (on alternating days)  that you know would usually make you short of breath  Work on inhaler technique:   Pantoprazole (protonix) 40 mg   Take  30-60 min before first meal of the day and Pepcid (famotidine)  20 mg after supper until return to office GERD rx Please schedule a follow up office visit in 6 weeks, call sooner if needed with all medications /inhalers/ solutions in hand so we can verify exactly what you are taking. This includes all medications from all doctors and over the counters    01/29/2023  f/u ov/ office/Mandy Garcia re: UACS/ bronchiectasis   Chief Complaint  Patient presents with   Follow-up    Pt f/u she states the she is feeling  better still has a slight cough and some SOB but it has gotten better   Dyspnea:  across parking lot better on breztri/ worse when ran out Cough: light green  Sleeping: level bed / 2 pillows does fine  SABA use: not using at all  02: none  Covid status: none / infected 2020  Rec Change pepcid to 20  mg take after breakfast and after supper  For breakthru heartburn take Maalox plus  Try Breztri one twice daily  GERD diet reviewed, bed blocks rec   Please schedule a follow up visit in 3 months but call sooner if needed with PFTs hopefully same day    05/12/2023  f/u ov/Mandy Garcia re: ***   maint on ***  No chief complaint on file.   Dyspnea:  *** Cough: *** Sleeping: *** SABA use: *** 02: *** Covid status:   *** Lung cancer screening :  ***    No obvious day to day or daytime variability or assoc excess/ purulent sputum or mucus plugs or hemoptysis or cp or chest tightness, subjective wheeze or overt sinus or hb symptoms.   *** without nocturnal  or early am exacerbation  of respiratory  c/o's or need for noct  saba. Also denies any obvious fluctuation of symptoms with weather or environmental changes or other aggravating or alleviating factors except as outlined above   No unusual exposure hx or h/o childhood pna/ asthma or knowledge of premature birth.  Current Allergies, Complete Past Medical History, Past Surgical History, Family History, and Social History were reviewed in [employer] record.  ROS  The following are not active complaints unless bolded Hoarseness, sore throat, dysphagia, dental problems, itching, sneezing,  nasal congestion or discharge of excess mucus or purulent secretions, ear ache,   fever, chills, sweats, unintended wt loss or wt gain, classically pleuritic or exertional cp,  orthopnea pnd or arm/hand swelling  or leg swelling, presyncope, palpitations, abdominal pain, anorexia, nausea, vomiting, diarrhea  or change in bowel habits or change  in bladder habits, change in stools or change in urine, dysuria, hematuria,  rash, arthralgias, visual complaints, headache, numbness, weakness or ataxia or problems with walking or coordination,  change in mood or  memory.        No outpatient medications have been marked as taking for the 05/12/23 encounter (Appointment) with Nyoka Cowden, MD.   Current Facility-Administered Medications for the 05/12/23 encounter (Appointment) with Nyoka Cowden, MD  Medication   cyanocobalamin ((VITAMIN B-12)) injection 1,000 mcg                 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    Mental disorder    Shoulder pain         Objective:    Wts    05/12/2023         ***  01/29/2023        194  12/15/22 204 lb (92.5 kg)  10/30/22 201 lb 3.2 oz (91.3 kg)  10/02/22 194 lb (88 kg)      Vital signs reviewed  05/12/2023  - Note at rest 02 sats  ***% on ***   General appearance:    ***          Assessment

## 2023-05-12 ENCOUNTER — Ambulatory Visit (INDEPENDENT_AMBULATORY_CARE_PROVIDER_SITE_OTHER): Payer: 59

## 2023-05-12 ENCOUNTER — Encounter: Payer: Self-pay | Admitting: Internal Medicine

## 2023-05-12 ENCOUNTER — Ambulatory Visit (INDEPENDENT_AMBULATORY_CARE_PROVIDER_SITE_OTHER): Payer: 59 | Admitting: Internal Medicine

## 2023-05-12 ENCOUNTER — Ambulatory Visit: Payer: 59 | Admitting: Internal Medicine

## 2023-05-12 VITALS — BP 114/68 | HR 68 | Temp 98.1°F | Ht 63.5 in | Wt 179.8 lb

## 2023-05-12 DIAGNOSIS — J479 Bronchiectasis, uncomplicated: Secondary | ICD-10-CM

## 2023-05-12 DIAGNOSIS — R058 Other specified cough: Secondary | ICD-10-CM | POA: Diagnosis not present

## 2023-05-12 DIAGNOSIS — R079 Chest pain, unspecified: Secondary | ICD-10-CM | POA: Diagnosis not present

## 2023-05-12 DIAGNOSIS — F17210 Nicotine dependence, cigarettes, uncomplicated: Secondary | ICD-10-CM

## 2023-05-12 LAB — PULMONARY FUNCTION TEST
DL/VA % pred: 102 %
DL/VA: 4.59 ml/min/mmHg/L
DLCO cor % pred: 67 %
DLCO cor: 14.63 ml/min/mmHg
DLCO unc % pred: 67 %
DLCO unc: 14.63 ml/min/mmHg
FEF 25-75 Post: 0.46 L/sec
FEF 25-75 Pre: 0.58 L/sec
FEF2575-%Change-Post: -20 %
FEF2575-%Pred-Post: 14 %
FEF2575-%Pred-Pre: 18 %
FEV1-%Change-Post: 18 %
FEV1-%Pred-Post: 39 %
FEV1-%Pred-Pre: 33 %
FEV1-Post: 1.18 L
FEV1-Pre: 1 L
FEV1FVC-%Change-Post: 25 %
FEV1FVC-%Pred-Pre: 57 %
FEV6-%Change-Post: -2 %
FEV6-%Pred-Post: 55 %
FEV6-%Pred-Pre: 56 %
FEV6-Post: 1.98 L
FEV6-Pre: 2.03 L
FEV6FVC-%Change-Post: 3 %
FEV6FVC-%Pred-Post: 100 %
FEV6FVC-%Pred-Pre: 97 %
FVC-%Change-Post: -5 %
FVC-%Pred-Post: 54 %
FVC-%Pred-Pre: 58 %
FVC-Post: 2 L
FVC-Pre: 2.12 L
Post FEV1/FVC ratio: 59 %
Post FEV6/FVC ratio: 99 %
Pre FEV1/FVC ratio: 47 %
Pre FEV6/FVC Ratio: 95 %
RV % pred: 143 %
RV: 2.21 L
TLC % pred: 79 %
TLC: 3.98 L

## 2023-05-12 LAB — CBC WITH DIFFERENTIAL/PLATELET
Basophils Absolute: 0 10*3/uL (ref 0.0–0.1)
Basophils Relative: 0.7 % (ref 0.0–3.0)
Eosinophils Absolute: 0.1 10*3/uL (ref 0.0–0.7)
Eosinophils Relative: 1.1 % (ref 0.0–5.0)
HCT: 47.3 % — ABNORMAL HIGH (ref 36.0–46.0)
Hemoglobin: 15.3 g/dL — ABNORMAL HIGH (ref 12.0–15.0)
Lymphocytes Relative: 30 % (ref 12.0–46.0)
Lymphs Abs: 1.7 10*3/uL (ref 0.7–4.0)
MCHC: 32.5 g/dL (ref 30.0–36.0)
MCV: 87.9 fl (ref 78.0–100.0)
Monocytes Absolute: 0.4 10*3/uL (ref 0.1–1.0)
Monocytes Relative: 7.1 % (ref 3.0–12.0)
Neutro Abs: 3.5 10*3/uL (ref 1.4–7.7)
Neutrophils Relative %: 61.1 % (ref 43.0–77.0)
Platelets: 200 10*3/uL (ref 150.0–400.0)
RBC: 5.38 Mil/uL — ABNORMAL HIGH (ref 3.87–5.11)
RDW: 14.7 % (ref 11.5–15.5)
WBC: 5.7 10*3/uL (ref 4.0–10.5)

## 2023-05-12 LAB — SEDIMENTATION RATE: Sed Rate: 6 mm/hr (ref 0–20)

## 2023-05-12 MED ORDER — BREZTRI AEROSPHERE 160-9-4.8 MCG/ACT IN AERO: 2.0000 | INHALATION_SPRAY | Freq: Two times a day (BID) | RESPIRATORY_TRACT | 0 refills | Status: DC

## 2023-05-12 MED ORDER — BREZTRI AEROSPHERE 160-9-4.8 MCG/ACT IN AERO: INHALATION_SPRAY | RESPIRATORY_TRACT | 11 refills | Status: DC

## 2023-05-12 NOTE — Patient Instructions (Addendum)
Please remember to go to the lab department   for your tests - we will call you with the results when they are available.      Please remember to go to the xray department   for your tests - we will call you with the results when they are available.      The key is to stop smoking completely before smoking completely stops you!    Change breztri to Take 2 puffs first thing in am and then another 2 puffs about 12 hours later.     Only use your albuterol as a rescue medication to be used if you can't catch your breath by resting or doing a relaxed purse lip breathing pattern.  - The less you use it, the better it will work when you need it. - Ok to use up to 2 puffs  every 4 hours if you must but call for immediate appointment if use goes up over your usual need - Don't leave home without it !!  (think of it like the spare tire for your car)   Also  Ok to try albuterol 15 min before an activity (on alternating days)  that you know would usually make you short of breath and see if it makes any difference and if makes none then don't take albuterol after activity unless you can't catch your breath as this means it's the resting that helps, not the albuterol.  Please schedule a follow up office visit in 6 weeks, call sooner if needed with all medications /inhalers/ solutions in hand so we can verify exactly what you are taking. This includes all medications from all doctors and over the counters

## 2023-05-12 NOTE — Patient Instructions (Signed)
Full PFT performed today. °

## 2023-05-12 NOTE — Assessment & Plan Note (Addendum)
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  -  12/15/2022  :  allergy screen Eos 0.1  IgE < 2  alpha one AT phenotype  MM level 156  - 01/29/2023  After extensive coaching inhaler device,  effectiveness =   75% (short ti) continue breztri 1-2 bid  - 05/12/2023  After extensive coaching inhaler device,  effectiveness =  75% ( short ti/ poor trigger at onset of insp)  - PFT's  05/12/2023   FEV1 1.18 (39 % ) ratio 0.59  p 18 % improvement from saba p 0 prior to study with DLCO  14.64 (67%)   and FV curve abnormal in effort dep portion and ERV 25%  at wt 178   -  05/12/2023  After extensive coaching inhaler device,  effectiveness =    50% from a baseline of 25% > continue breztri with or without spacer. - 05/12/2023  quant Igs/ [facility] serology   Has atypical f/v loop so doubt she's really severely obstructed (obstructive bronchiectasis more likely than copd based on age) but does have reversible component so rec max rx for now with breztri 2bid and close fu    -  05/12/2023  After extensive coaching inhaler device,  effectiveness =    50% from a baseline of 25% > continue breztri with or without spacer but should bring to office for training if prefers using spacer  F/u q 6 weeks for now with all meds in hand using a trust but verify approach to confirm accurate Medication  Reconciliation The principal here is that until we are certain that the  patients are doing what we've asked, it makes no sense to ask them to do more.

## 2023-05-12 NOTE — Progress Notes (Signed)
Full PFT performed today. °

## 2023-05-12 NOTE — Assessment & Plan Note (Signed)
4-5 min discussion re active cigarette smoking in addition to office E&M  Ask about tobacco use:   ongoing Advise quitting   I took an extended  opportunity with this patient to outline the consequences of continued cigarette use  in airway disorders based on all the data we have from the multiple national lung health studies (perfomed over decades at millions of dollars in cost)  indicating that smoking cessation, not choice of inhalers or pulmonary  physicians, is the most important aspect of her  care.   Assess willingness:  Not committed at this point Assist in quit attempt:  Per PCP when ready Arrange follow up:   Follow up per Primary Care planned      

## 2023-05-13 NOTE — Assessment & Plan Note (Addendum)
Onset 08/2022 in active smoker with prominent pseudowheeze / bronchiecatasis on CT 10/30/22  -  12/15/2022  rec augmentin x 10 da and f/u with sinus ct if not better -  12/15/2022  :  allergy screen Eos 0.1  IgE < 2  - 12/15/2022 rec max rx for GERD / pred x 6 days and f/u in 6 weeks   Somewhat improved at presesent/ continue max pepcid rx since can't take ppi

## 2023-05-17 ENCOUNTER — Telehealth: Payer: Self-pay

## 2023-05-17 NOTE — Telephone Encounter (Signed)
*  Pulm  PA request received via CMM for Breztri Aerosphere 160-9-4.8MCG/ACT aerosol  PA submitted to [employer] via CMM and is pending determination   *Patient has not tried any other inhalers.   Key: [id number]

## 2023-05-18 NOTE — Telephone Encounter (Signed)
Please advise? PA for Mandy Garcia has been denied Preferred alternatives listed below  Formulary alternative(s) are Bevespi Aerosphere, Anoro Ellipta, Trelegy Ellipta.

## 2023-05-18 NOTE — Telephone Encounter (Signed)
Give samples of breztri till returns to regroup as not sure what the best alternative would be

## 2023-05-18 NOTE — Telephone Encounter (Signed)
PA has been DENIED:   Coverage for this medication is denied for the following reason(s). We reviewed the information we received about your condition and circumstances. We used the plan approved policy when making this decision. The policy states that this medication may be approved when: -The member has a clinical condition or needs a specific dosage form for which there is no alternative on the formulary OR -The listed formulary alternatives are not recommended based on published guidelines or clinical literature OR -The formulary alternatives will likely be ineffective or less effective for the member OR -The formulary alternatives will likely cause an adverse effect OR -The member is unable to take the required number of formulary alternatives for the given diagnosis due to a trial and inadequate treatment response or contraindication OR -The member has tried and failed the required number of formulary alternatives. Based on the policy and the information we have, your request is denied. We did not receive any documentation that you meet any of the criteria outlined above. Formulary alternative(s) are [employer], Anoro Ellipta, Trelegy Ellipta. Requirement: 3 in a class with 3 or more alternatives, 2 in a class with 2 alternatives, or 1 in a class with only 1 alternative. Please refer to your plan documents for a complete list of alternatives.

## 2023-05-19 ENCOUNTER — Other Ambulatory Visit: Payer: Self-pay | Admitting: Internal Medicine

## 2023-05-19 ENCOUNTER — Telehealth: Payer: Self-pay

## 2023-05-19 MED ORDER — BREZTRI AEROSPHERE 160-9-4.8 MCG/ACT IN AERO: 2.0000 | INHALATION_SPRAY | Freq: Two times a day (BID) | RESPIRATORY_TRACT | 0 refills | Status: DC

## 2023-05-19 NOTE — Telephone Encounter (Signed)
Labs are ok so far, not all back yet that's why she hasn't heard from [place]   One was [facility] serology ? Did we send it to the wrong lab (should have been [facility])

## 2023-05-19 NOTE — Telephone Encounter (Signed)
Called and spoke with patient. I advised her that since she is a  patient, I would see if that office had samples. She verbalized understanding.   Holly, do you have any samples of Breztri in [place]?

## 2023-05-19 NOTE — Telephone Encounter (Signed)
Pt calling to get results from lab work - Dr.Wert please review and advise.

## 2023-05-19 NOTE — Addendum Note (Signed)
Addended by: Jaynee Eagles on: 05/19/2023 03:44 PM   Modules accepted: Orders

## 2023-05-19 NOTE — Telephone Encounter (Signed)
Called and spoke w/ pt let her know that I do have samples of Breztri at the [place] [facility]. She verbalized that she will come an get them Friday when she is off.   Samples have been charted and placed @ desk. NFN att.

## 2023-05-21 LAB — HYPERSENSITIVITY PNUEMONITIS PROFILE
ASPERGILLUS FUMIGATUS: NEGATIVE
Faenia retivirgula: NEGATIVE
Pigeon Serum: NEGATIVE
S. VIRIDIS: NEGATIVE
T. CANDIDUS: NEGATIVE
T. VULGARIS: NEGATIVE

## 2023-05-21 LAB — IGG, IGA, IGM
IgG (Immunoglobin G), Serum: 713 mg/dL (ref 600–1640)
IgM, Serum: 215 mg/dL (ref 50–300)
Immunoglobulin A: 36 mg/dL — ABNORMAL LOW (ref 47–310)

## 2023-05-21 LAB — D-DIMER, QUANTITATIVE: D-Dimer, Quant: <0.19 mcg/mL FEU (ref <0.50)

## 2023-05-21 NOTE — Telephone Encounter (Signed)
I bet insurance will pay for generic symbicort 80 Take 2 puffs first thing in am and then another 2 puffs about 12 hours later.    If they turn this down too, ok to try trelegy 100 one clck each am

## 2023-05-21 NOTE — Telephone Encounter (Signed)
Pharm team- will you please confirm if the generic symbicort 80 is covered or not? Thanks!

## 2023-05-21 NOTE — Telephone Encounter (Signed)
Dr Sherene Sires- ins prefers trelegy   Please advise if okay to switch

## 2023-05-22 DIAGNOSIS — K219 Gastro-esophageal reflux disease without esophagitis: Secondary | ICD-10-CM | POA: Diagnosis not present

## 2023-05-22 DIAGNOSIS — F17210 Nicotine dependence, cigarettes, uncomplicated: Secondary | ICD-10-CM | POA: Diagnosis not present

## 2023-05-22 DIAGNOSIS — J479 Bronchiectasis, uncomplicated: Secondary | ICD-10-CM | POA: Diagnosis not present

## 2023-05-22 DIAGNOSIS — Z809 Family history of malignant neoplasm, unspecified: Secondary | ICD-10-CM | POA: Diagnosis not present

## 2023-05-22 DIAGNOSIS — J849 Interstitial pulmonary disease, unspecified: Secondary | ICD-10-CM | POA: Diagnosis not present

## 2023-05-24 ENCOUNTER — Encounter (HOSPITAL_COMMUNITY): Payer: Self-pay | Admitting: Hematology and Oncology

## 2023-05-24 ENCOUNTER — Other Ambulatory Visit (HOSPITAL_COMMUNITY): Payer: Self-pay

## 2023-05-24 MED ORDER — BUDESONIDE-FORMOTEROL FUMARATE 80-4.5 MCG/ACT IN AERO: 2.0000 | INHALATION_SPRAY | Freq: Two times a day (BID) | RESPIRATORY_TRACT | 11 refills | Status: DC

## 2023-05-24 NOTE — Telephone Encounter (Signed)
Called and spoke w/ pt provided her lab work results per MW.

## 2023-05-24 NOTE — Telephone Encounter (Signed)
Per test claim generic Symbicort 80 is covered at this time with a $0.00 co-pay.

## 2023-05-24 NOTE — Telephone Encounter (Signed)
I called and spoke with the pt and notified of response per Dr Sherene Sires  She verbalized understanding  Rx changed to generic symbicort 80  Sent to her preferred pharmacy

## 2023-06-23 ENCOUNTER — Encounter: Payer: Self-pay | Admitting: Internal Medicine

## 2023-06-23 ENCOUNTER — Ambulatory Visit: Payer: 59 | Admitting: Internal Medicine

## 2023-06-23 VITALS — BP 112/66 | HR 65 | Ht 63.5 in | Wt 173.0 lb

## 2023-06-23 DIAGNOSIS — F17210 Nicotine dependence, cigarettes, uncomplicated: Secondary | ICD-10-CM | POA: Diagnosis not present

## 2023-06-23 DIAGNOSIS — J479 Bronchiectasis, uncomplicated: Secondary | ICD-10-CM

## 2023-06-23 DIAGNOSIS — R058 Other specified cough: Secondary | ICD-10-CM

## 2023-06-23 MED ORDER — FAMOTIDINE 20 MG PO TABS: 20.0000 mg | ORAL_TABLET | Freq: Every day | ORAL | 11 refills | Status: DC

## 2023-06-23 MED ORDER — RABEPRAZOLE SODIUM 20 MG PO TBEC: 20.0000 mg | DELAYED_RELEASE_TABLET | Freq: Every day | ORAL | 11 refills | Status: DC

## 2023-06-23 NOTE — Assessment & Plan Note (Addendum)
Onset 08/2022 in active smoker with prominent pseudowheeze / bronchiecatasis on CT 10/30/22 s tracheal obst -  12/15/2022  rec augmentin x 10 da and f/u with sinus ct if not better -  12/15/2022  :  allergy screen Eos 0.1  IgE < 2  - 12/15/2022 rec max rx for GERD / pred x 6 days and f/u in 6 weeks > could not take protonix - 06/23/2023 trial of aciphex and pepcid and refer to ENT   Persistent "wheeze" on exam corresponds to variable obstruction pattern on f/v loop suggesting extrathoracic issue and since Ct neg for mass or thyroid problem ? Is this VCD? Or problem in upper glottis not seen on CT > ent eval next and in meantime added max gerd rx with aciphex which should be better to than protonix which gave her GI side effects.  F/u in 3 m, sooner if needed         Each maintenance medication was reviewed in detail including emphasizing most importantly the difference between maintenance and prns and under what circumstances the prns are to be triggered using an action plan format where appropriate.  Total time for H and P, chart review, counseling, reviewing hfa device(s) and generating customized AVS unique to this office visit / same day charting > 30 min for multiple  refractory respiratory  symptoms of uncertain etiology

## 2023-06-23 NOTE — Assessment & Plan Note (Signed)
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  -  12/15/2022  :  allergy screen Eos 0.1  IgE < 2  alpha one AT phenotype  MM level 156  - 01/29/2023  After extensive coaching inhaler device,  effectiveness =   75% (short ti) continue breztri 1-2 bid  - 05/12/2023  After extensive coaching inhaler device,  effectiveness =  75% ( short ti/ poor trigger at onset of insp)  - PFT's  05/12/2023   FEV1 1.18 (39 % ) ratio 0.59  p 18 % improvement from saba p 0 prior to study with DLCO  14.64 (67%)   and FV curve abnormal in effort dep portion and ERV 25%  at wt 178   - 05/12/2023  quant Igs/ HSP serology nl - 06/23/2023  After extensive coaching inhaler device,  effectiveness =    60%(short Ti, variable flow)  I don't think she severe copd as suggested by pfts but already on  triple rx (when can access Breztri- samples/coupons given) so nothing else to add to her care but rec stop smoking and use saba approp:  Re SABA :  I spent extra time with pt today reviewing appropriate use of albuterol for prn use on exertion with the following points: 1) saba is for relief of sob that does not improve by walking a slower pace or resting but rather if the pt does not improve after trying this first. 2) If the pt is convinced, as many are, that saba helps recover from activity faster then it's easy to tell if this is the case by re-challenging : ie stop, take the inhaler, then p 5 minutes try the exact same activity (intensity of workload) that just caused the symptoms and see if they are substantially diminished or not after saba 3) if there is an activity that reproducibly causes the symptoms, try the saba 15 min before the activity on alternate days   If in fact the saba really does help, then fine to continue to use it prn but advised may need to look closer at the maintenance regimen being used to achieve better control of airways disease with exertion.

## 2023-06-23 NOTE — Progress Notes (Signed)
Mandy Garcia, female    DOB: 18-Mar-1986    MRN: [id number]   Brief patient profile:  84  yowf  active smoker/MM with bad asthma as child outgrew completely  by age 37 able to do sports s inhalers  referred to pulmonary clinic in [place]  12/15/2022 by [name]  for sob/ cough abrupt onset 08/2022  abruptly with "typical URI for her" but dx as PNA (not covid testing per pt)  but did not resolve despite multiple antibiotics and prednisone.     History of Present Illness  12/15/2022  Pulmonary/ 1st office eval/ Jody Aguinaga / [place] [facility]  Chief Complaint  Patient presents with   Consult    SOB and cough ILD/ pulm nodules   Dyspnea:  walking  75 yards to car/ flat  Cough: worse after supper which is her biggest meal / worst at hs then sleeps ok  Light green mucus  Sleep: flat bed/ 2 pillows  SABA use: not covinced it helps 02: none  Rec See Dentist as soon as you can - this is really important for your lung health. For cough > mucinex dm 1200 mg one every 12 hours as needed (OTC)  Prednisone 10 mg take  4 each am x 2 days,   2 each am x 2 days,  1 each am x 2 days and stop  Augmentin 875 mg take one pill twice daily  X 10 days -  For shortness > breztri 1-2 every hours or albuterol 2 puffs every 4 hours as needed Ok to try albuterol or breztri 15-30 min before an activity (on alternating days)  that you know would usually make you short of breath  Work on inhaler technique:   Pantoprazole (protonix) 40 mg   Take  30-60 min before first meal of the day and Pepcid (famotidine)  20 mg after supper until return to office GERD rx Please schedule a follow up office visit in 6 weeks, call sooner if needed with all medications /inhalers/ solutions in hand so we can verify exactly what you are taking. This includes all medications from all doctors and over the counters    01/29/2023  f/u ov/[place] office/Cheyane Ayon re: UACS/ bronchiectasis   Chief Complaint  Patient presents with    Follow-up    Pt f/u she states the she is feeling better still has a slight cough and some SOB but it has gotten better  Dyspnea:  across parking lot better on breztri/ worse when ran out Cough: light green  Sleeping: level bed / 2 pillows does fine  SABA use: not using at all  02: none  Covid status: none / infected 2020  Rec Change pepcid to 20  mg take after breakfast and after supper  For breakthru heartburn take Maalox plus  Try Breztri one twice daily  GERD diet reviewed, bed blocks rec     05/12/2023  f/u ov/Bartlomiej Jenkinson re: uacs/ bronchiectasis maint on breztri one bid   Chief Complaint  Patient presents with   Follow-up    Review PFT.  C/o L side painful with SOB x 6 weeks.  Some wheeze  Dyspnea:  across parking lot better Cough: at hs then resolves; no production Sleeping: 4 in bed blocks/ no resp cc / no am flare  SABA use: maybe one or twice  02: none  L flank pain worse with deep breathing/ coughing or rolling over on L side Rec The key is to stop smoking completely before smoking completely stops  you! Change breztri to Take 2 puffs first thing in am and then another 2 puffs about 12 hours later.  Only use your albuterol as a rescue medication Also  Ok to try albuterol 15 min before an activity (on alternating days)  that you know would usually make you short of breath Please schedule a follow up office visit in 6 weeks, call sooner if needed with all medications /inhalers/ solutions in hand    06/23/2023  f/u ov/[place] office/Kyrianna Barletta re: uacs/ GOLD 3copd/bronchiectasis  maint on Breztri  did not  bring meds - got symbicort (she thinks) but did not bring meds doing as well with doe as on Breztri /still smoking Chief Complaint  Patient presents with   Follow-up    Breathing is about the same. She uses the albuterol inhaler at least twice daily when she is at work. Breathing gets worse when out it the humid weather.  She has a dry cough in the am.   Dyspnea:  thinks this was  better with breztri than symbicort Cough: min in am some congestion > no production p 5 min Sleeping: 4 in bed blocks no resp cc  SABA use: 1-2 x times usually only after ex  02: none    No obvious day to day or daytime variability or assoc excess/ purulent sputum or mucus plugs or hemoptysis or cp or chest tightness, subjective wheeze or overt sinus or hb symptoms.    . Also denies any obvious fluctuation of symptoms with weather or environmental changes or other aggravating or alleviating factors except as outlined above   No unusual exposure hx or h/o childhood pna/  or knowledge of premature birth.  Current Allergies, Complete Past Medical History, Past Surgical History, Family History, and Social History were reviewed in [employer] record.  ROS  The following are not active complaints unless bolded Hoarseness, sore throat, dysphagia, dental problems, itching, sneezing,  nasal congestion or discharge of excess mucus or purulent secretions, ear ache,   fever, chills, sweats, unintended wt loss or wt gain, classically pleuritic or exertional cp,  orthopnea pnd or arm/hand swelling  or leg swelling, presyncope, palpitations, abdominal pain, anorexia, nausea, vomiting, diarrhea  or change in bowel habits or change in bladder habits, change in stools or change in urine, dysuria, hematuria,  rash, arthralgias, visual complaints, headache, numbness, weakness or ataxia or problems with walking or coordination,  change in mood or  memory.        Current Meds  Medication Sig   acetaminophen (TYLENOL) 500 MG tablet Take 1 tablet (500 mg total) by mouth every 6 (six) hours as needed.   albuterol (VENTOLIN HFA) 108 (90 Base) MCG/ACT inhaler Inhale 2 puffs into the lungs every 6 (six) hours as needed for wheezing or shortness of breath.   Budeson-Glycopyrrol-Formoterol (BREZTRI AEROSPHERE) 160-9-4.8 MCG/ACT AERO Take 2 puffs first thing in am and then another 2 puffs about 12  hours later.   famotidine (PEPCID) 20 MG tablet One after first and last meal   ferrous sulfate 325 (65 FE) MG EC tablet Take 325 mg by mouth 3 (three) times daily with meals.   hydrOXYzine (ATARAX) 25 MG tablet Take 1 tablet (25 mg total) by mouth every 8 (eight) hours as needed.   triamcinolone ointment (KENALOG) 0.5 % Apply 1 Application topically 2 (two) times daily.                     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    Mental disorder    Shoulder pain         Objective:    Wts    06/23/2023       173  05/12/2023       179  01/29/2023       194  12/15/22 204 lb (92.5 kg)  10/30/22 201 lb 3.2 oz (91.3 kg)  10/02/22 194 lb (88 kg)    Vital signs reviewed  06/23/2023  - Note at rest 02 sats  99% on RA   General appearance:    amb wf nad with audible  insp wheeze    HEENT : Oropharynx  clear  Nasal turbinates nl    NECK :  without  apparent JVD/ palpable Nodes/TM    LUNGS: no acc muscle use,  Mild barrel  contour chest wall with transmitted insp wheezing  and  without cough on insp or exp maneuvers  and mild  Hyperresonant  to  percussion bilaterally     CV:  RRR  no s3 or murmur or increase in P2, and no edema   ABD:  soft and nontender    MS:  Nl gait/ ext warm without deformities Or obvious joint restrictions  calf tenderness, cyanosis or clubbing     SKIN: warm and dry without lesions    NEURO:  alert, approp, nl sensorium with  no motor or cerebellar deficits apparent.              Assessment

## 2023-06-23 NOTE — Assessment & Plan Note (Signed)
4-5 min discussion re active cigarette smoking in addition to office E&M  Ask about tobacco use:   ongoing  Advise quitting   strongly advised on basis of ongoing refractory symptoms which can largely be attributed directly to smoking effects Assess willingness:  Not committed at this point - still smoking with co-workers on break and this pattern will need to change first  Assist in quit attempt:  Per PCP when ready Arrange follow up:   Follow up per Primary Care planned

## 2023-06-23 NOTE — Patient Instructions (Addendum)
Aciphex 20  mg   Take  30-60 min before first meal of the day and Pepcid (famotidine)  20 mg after supper until return to office - this is the best way to tell whether stomach acid is contributing to your problem.    My office will be contacting you by phone for referral to ENT in [place]   - if you don't hear back from my office within one week please call us back or notify us thru MyChart and we'll address it right away.   Work on inhaler technique:  relax and gently blow all the way out then take a nice smooth full deep breath back in, triggering the inhaler at same time you start breathing in.  Hold breath in for at least  5 seconds if you can. Blow out breztri  thru nose. Rinse and gargle with water when done.  If mouth or throat bother you at all,  try brushing teeth/gums/tongue with arm and hammer toothpaste/ make a slurry and gargle and spit out.   Only use your albuterol as a rescue medication to be used if you can't catch your breath by resting or doing a relaxed purse lip breathing pattern.  - The less you use it, the better it will work when you need it. - Ok to use up to 2 puffs  every 4 hours if you must but call for immediate appointment if use goes up over your usual need - Don't leave home without it !!  (think of it like the spare tire for your car)   Also  Ok to try albuterol 15 min before an activity (on alternating days)  that you know would usually make you short of breath and see if it makes any difference and if makes none then don't take albuterol after activity unless you can't catch your breath as this means it's the resting that helps, not the albuterol.      The key is to stop smoking completely before smoking completely stops you!  Please schedule a follow up visit in 3 months but call sooner if needed

## 2023-07-16 ENCOUNTER — Institutional Professional Consult (permissible substitution) (INDEPENDENT_AMBULATORY_CARE_PROVIDER_SITE_OTHER): Payer: 59 | Admitting: Otolaryngology

## 2023-07-30 ENCOUNTER — Ambulatory Visit (INDEPENDENT_AMBULATORY_CARE_PROVIDER_SITE_OTHER): Payer: 59 | Admitting: Family

## 2023-07-30 ENCOUNTER — Encounter (HOSPITAL_COMMUNITY): Payer: Self-pay | Admitting: Hematology and Oncology

## 2023-07-30 ENCOUNTER — Other Ambulatory Visit (HOSPITAL_COMMUNITY)
Admission: RE | Admit: 2023-07-30 | Discharge: 2023-07-30 | Disposition: A | Discharge status: Home / Self Care | Payer: 59 | Source: Ambulatory Visit | Attending: Family | Admitting: Family

## 2023-07-30 ENCOUNTER — Encounter: Payer: Self-pay | Admitting: Family

## 2023-07-30 VITALS — BP 125/85 | HR 53 | Temp 97.6°F | Ht 63.5 in | Wt 171.2 lb

## 2023-07-30 DIAGNOSIS — H6993 Unspecified Eustachian tube disorder, bilateral: Secondary | ICD-10-CM | POA: Diagnosis not present

## 2023-07-30 DIAGNOSIS — R058 Other specified cough: Secondary | ICD-10-CM

## 2023-07-30 DIAGNOSIS — J479 Bronchiectasis, uncomplicated: Secondary | ICD-10-CM | POA: Diagnosis not present

## 2023-07-30 DIAGNOSIS — Z01411 Encounter for gynecological examination (general) (routine) with abnormal findings: Secondary | ICD-10-CM

## 2023-07-30 DIAGNOSIS — Z0000 Encounter for general adult medical examination without abnormal findings: Secondary | ICD-10-CM | POA: Insufficient documentation

## 2023-07-30 DIAGNOSIS — E663 Overweight: Secondary | ICD-10-CM

## 2023-07-30 DIAGNOSIS — D509 Iron deficiency anemia, unspecified: Secondary | ICD-10-CM

## 2023-07-30 DIAGNOSIS — Z01419 Encounter for gynecological examination (general) (routine) without abnormal findings: Secondary | ICD-10-CM

## 2023-07-30 DIAGNOSIS — L301 Dyshidrosis [pompholyx]: Secondary | ICD-10-CM

## 2023-07-30 DIAGNOSIS — K5900 Constipation, unspecified: Secondary | ICD-10-CM | POA: Diagnosis not present

## 2023-07-30 DIAGNOSIS — F17210 Nicotine dependence, cigarettes, uncomplicated: Secondary | ICD-10-CM | POA: Diagnosis not present

## 2023-07-30 MED ORDER — CETIRIZINE HCL 10 MG PO TABS: 10.0000 mg | ORAL_TABLET | Freq: Every day | ORAL | 1 refills | Status: DC

## 2023-07-30 MED ORDER — TRIAMCINOLONE ACETONIDE 0.5 % EX OINT: 1.0000 | TOPICAL_OINTMENT | Freq: Two times a day (BID) | CUTANEOUS | 2 refills | Status: AC

## 2023-07-30 MED ORDER — FLUTICASONE PROPIONATE 50 MCG/ACT NA SUSP: 2.0000 | Freq: Every day | NASAL | 6 refills | Status: DC

## 2023-07-30 NOTE — Patient Instructions (Signed)
Menorrhagia Menorrhagia is a form of abnormal uterine bleeding in which menstrual periods are heavy or last longer than normal. With menorrhagia, the periods may cause enough blood loss and cramping that a woman becomes unable to take part in her usual activities. What are the causes? Common causes of this condition include: Polyps or fibroids. These are noncancerous growths in the uterus. An imbalance of the hormones estrogen and progesterone. Anovulation, which occurs when one of the ovaries does not release an egg during one or more months. A problem with the thyroid gland (hypothyroidism). Side effects of having an intrauterine device (IUD). Side effects of some medicines, such as NSAIDs or blood thinners. A bleeding disorder that stops the blood from clotting normally. In some cases, the cause of this condition is not known. What increases the risk? You are more likely to develop this condition if you have cancer of the uterus. What are the signs or symptoms? Symptoms of this condition include: Routinely having to change your pad or tampon every 1-2 hours because it is soaked. Needing to use pads and tampons at the same time because of heavy bleeding. Needing to wake up to change your pads or tampons during the night. Passing blood clots larger than 1 inch (2.5 cm) in size. Having bleeding that lasts for more than 7 days. Having symptoms of low iron levels (anemia), such as tiredness (fatigue) or shortness of breath. How is this diagnosed? This condition may be diagnosed based on: A physical exam. Your symptoms and menstrual history. Tests, such as: Blood tests to check if you are pregnant or if you have hormonal changes, a bleeding or thyroid disorder, anemia, or other problems. Pap test to check for cancerous changes, infections, or inflammation. Endometrial biopsy. This test involves removing a tissue sample from the lining of the uterus (endometrium) to be examined under a  microscope. Pelvic ultrasound. This test uses sound waves to create images of your uterus, ovaries, and vagina. The images can show if you have fibroids or other growths. Hysteroscopy. For this test, a thin, flexible tube with a light on the end (hysteroscope) is used to look inside your uterus. How is this treated? Treatment may not be needed for this condition. If it is needed, the best treatment for you will depend on: Whether you need to prevent pregnancy. Your desire to have children in the future. The cause and severity of your bleeding. Your personal preference. Medicine Medicines are the first step in treatment. You may be treated with: Hormonal birth control methods. These treatments reduce bleeding during your menstrual period. They include: Birth control pills. Skin patch. Vaginal ring. Shots (injections) that you get every 3 months. Hormonal IUD. Implants that go under the skin. Medicines that thicken the blood and slow bleeding. Medicines that reduce swelling, such as ibuprofen. Medicines that contain an artificial (synthetic) hormone called progestin. Medicines that make the ovaries stop working for a short time. Iron supplements to treat anemia.  Surgery If medicines do not work, surgery may be done. Surgical options may include: Dilation and curettage (D&C). In this procedure, your health care provider opens the lowest part of the uterus (cervix) and then scrapes or suctions tissue from the endometrium. This reduces menstrual bleeding. Operative hysteroscopy. In this procedure, a hysteroscope is used to view your uterus and help remove polyps that may be causing heavy periods. Endometrial ablation. This is when various techniques are used to permanently destroy your entire endometrium. After endometrial ablation, most women have little   or no menstrual flow. This procedure reduces your ability to become pregnant. Endometrial resection. In this procedure, an electrosurgical  wire loop is used to remove the endometrium. This procedure reduces your ability to become pregnant. Hysterectomy. This is surgical removal of your uterus. This is a permanent procedure that stops menstrual periods. Pregnancy is not possible after a hysterectomy. Follow these instructions at home: Medicines Take over-the-counter and prescription medicines only as told by your health care provider. This includes iron pills. Do not change or switch medicines without asking your health care provider. Do not take aspirin or medicines that contain aspirin 1 week before or during your menstrual period. Aspirin may make bleeding worse. Managing constipation Your iron pills may cause constipation. If you are taking prescription iron supplements, you may need to take these actions to prevent or treat constipation: Drink enough fluid to keep your urine pale yellow. Take over-the-counter or prescription medicines. Eat foods that are high in fiber, such as beans, whole grains, and fresh fruits and vegetables. Limit foods that are high in fat and processed sugars, such as fried or sweet foods. General instructions If you need to change your sanitary pad or tampon more than once every 2 hours, limit your activity until the bleeding stops. Eat well-balanced meals, including foods that are high in iron. Foods that have a lot of iron include leafy green vegetables, meat, liver, eggs, and whole-grain breads and cereals. Do not try to lose weight until the abnormal bleeding has stopped and your blood iron level is back to normal. If you need to lose weight, work with your health care provider to lose weight safely. Keep all follow-up visits. This is important. Contact a health care provider if: You soak through a pad or tampon every 1 or 2 hours, and this happens every time you have a period. You need to use pads and tampons at the same time because you are bleeding so much. You have nausea, vomiting, diarrhea, or  other problems related to medicines you are taking. Get help right away if: You soak through more than a pad or tampon in 1 hour. You pass clots bigger than 1 inch (2.5 cm) wide. You feel short of breath. You feel like your heart is beating too fast. You feel dizzy or you faint. You feel very weak or tired. Summary Menorrhagia is a form of abnormal uterine bleeding in which menstrual periods are heavy or last longer than normal. Treatment may not be needed for this condition. If it is needed, it may include medicines or procedures. Take over-the-counter and prescription medicines only as told by your health care provider. This includes iron pills. Get help right away if you have heavy bleeding that soaks through more than a pad or tampon in 1 hour, you pass large clots, or you feel dizzy, short of breath, or very weak or tired. This information is not intended to replace advice given to you by your health care provider. Make sure you discuss any questions you have with your health care provider. Document Revised: 08/13/2020 Document Reviewed: 08/13/2020 Elsevier Patient Education  2024 [employer].

## 2023-07-30 NOTE — Progress Notes (Signed)
Subjective:    Patient ID: Mandy Garcia, female    DOB: 04/01/1986, 37 y.o.   MRN: [id number]  Chief Complaint  Patient presents with   Annual Exam   Ear Pain    Right ear x 1 week   Pt presents to the office today for CPE with pap. She had pap last year, but requesting pap completed. Reports her mother had ovarian cancer and this worries her.   She continues to have menorrhagia with 2 days of heavy bleeding. Reports using 20 pads in those 2 days. She has seen GYN and recommend hysterectomy, however, she can not miss a month of work.   She is followed by Pulmonologist for lung nodule, upper airway cough, and  COPD.   Constipation This is a chronic problem. The current episode started more than 1 year ago.  Nicotine Dependence Presents for follow-up visit. Her urge triggers include company of smokers. The symptoms have been stable. She smokes < 1/2 a pack of cigarettes per day.  Gynecologic Exam The patient's primary symptoms include vaginal bleeding. The patient's pertinent negatives include no genital itching, genital lesions or genital odor. This is a chronic problem. The current episode started more than 1 year ago. The problem occurs intermittently. Associated symptoms include constipation.  Ear Fullness  There is pain in both ears. The problem occurs every few minutes. The pain is mild. Associated symptoms include hearing loss. She has tried nothing for the symptoms. The treatment provided mild relief.      Review of Systems  HENT:  Positive for hearing loss.   Gastrointestinal:  Positive for constipation.  All other systems reviewed and are negative.  Family History  Problem Relation Age of Onset   Hypertension Paternal Grandfather    Cancer Maternal Grandmother        lung   Diabetes Maternal Grandfather    Hypertension Father    Ovarian cancer Mother    Cancer Mother        cervix   Hypertension Mother    Diabetes Sister    Hypertension Sister    Bipolar  disorder Daughter    Tourette syndrome Son    ADD / ADHD Son    Social History   Socioeconomic History   Marital status: Married    Spouse name: Not on file   Number of children: Not on file   Years of education: Not on file   Highest education level: Not on file  Occupational History   Not on file  Tobacco Use   Smoking status: Every Day    Current packs/day: 0.50    Average packs/day: 0.5 packs/day for 16.0 years (8.0 ttl pk-yrs)    Types: Cigarettes    Passive exposure: Current   Smokeless tobacco: Never  Vaping Use   Vaping status: Never Used  Substance and Sexual Activity   Alcohol use: No   Drug use: No   Sexual activity: Not Currently    Birth control/protection: Surgical    Comment: tubal and ablation  Other Topics Concern   Not on file  Social History Narrative   Not on file   Social Determinants of Health   Financial Resource Strain: Low Risk  (03/13/2022)   Overall Financial Resource Strain (CARDIA)    Difficulty of Paying Living Expenses: Not hard at all  Food Insecurity: No Food Insecurity (03/13/2022)   Hunger Vital Sign    Worried About Running Out of Food in the Last Year: Never true  Ran Out of Food in the Last Year: Never true  Transportation Needs: No Transportation Needs (03/13/2022)   PRAPARE - Administrator, Civil Service (Medical): No    Lack of Transportation (Non-Medical): No  Physical Activity: Sufficiently Active (03/13/2022)   Exercise Vital Sign    Days of Exercise per Week: 7 days    Minutes of Exercise per Session: 100 min  Stress: No Stress Concern Present (03/13/2022)   [employer] of Occupational Health - Occupational Stress Questionnaire    Feeling of Stress : Only a little  Social Connections: Socially Isolated (03/13/2022)   Social Connection and Isolation Panel [NHANES]    Frequency of Communication with Friends and Family: More than three times a week    Frequency of Social Gatherings with Friends and  Family: More than three times a week    Attends Religious Services: Never    Database administrator or Organizations: No    Attends Banker Meetings: Never    Marital Status: Divorced       Objective:   Physical Exam Vitals reviewed.  Constitutional:      General: She is not in acute distress.    Appearance: She is well-developed.  HENT:     Head: Normocephalic and atraumatic.     Right Ear: Tympanic membrane normal.     Left Ear: Tympanic membrane normal.  Eyes:     Pupils: Pupils are equal, round, and reactive to light.  Neck:     Thyroid: No thyromegaly.  Cardiovascular:     Rate and Rhythm: Normal rate and regular rhythm.     Heart sounds: Normal heart sounds. No murmur heard. Pulmonary:     Effort: Pulmonary effort is normal. No respiratory distress.     Breath sounds: Normal breath sounds. No wheezing.  Chest:  Breasts:    Right: No swelling, bleeding, inverted nipple, mass, nipple discharge, skin change or tenderness.     Left: No swelling, bleeding, inverted nipple, mass, nipple discharge, skin change or tenderness.  Abdominal:     General: Bowel sounds are normal. There is no distension.     Palpations: Abdomen is soft.     Tenderness: There is no abdominal tenderness.  Genitourinary:    Comments: Bimanual exam- no adnexal masses or tenderness, ovaries nonpalpable   Cervix parous and pink- No discharge  Musculoskeletal:        General: No tenderness. Normal range of motion.     Cervical back: Normal range of motion and neck supple.  Skin:    General: Skin is warm and dry.  Neurological:     Mental Status: She is alert and oriented to person, place, and time.     Cranial Nerves: No cranial nerve deficit.     Deep Tendon Reflexes: Reflexes are normal and symmetric.  Psychiatric:        Behavior: Behavior normal.        Thought Content: Thought content normal.        Judgment: Judgment normal.      BP 125/85   Pulse (!) 53   Temp 97.6 F  (36.4 C) (Temporal)   Ht 5' 3.5" (1.613 m)   Wt 171 lb 3.2 oz (77.7 kg)   SpO2 97%   BMI 29.85 kg/m      Assessment & Plan:  Mandy Garcia comes in today with chief complaint of Annual Exam and Ear Pain (Right ear x 1 week)   Diagnosis and orders addressed:  1. Dyshidrotic eczema - triamcinolone ointment (KENALOG) 0.5 %; Apply 1 Application topically 2 (two) times daily.  Dispense: 60 g; Refill: 2 - CMP14+EGFR  2. Annual physical exam - CMP14+EGFR - Lipid panel - TSH - Anemia Profile B - Cytology - PAP([facility])  3. Encounter for gynecological examination without abnormal finding - CMP14+EGFR - Cytology - PAP()  4. Cigarette smok - CMP14+EGFR  5. Constipation, unspecified constipation type - CMP14+EGFR  6. Iron deficiency anemia, unspecified iron deficiency anemia type - CMP14+EGFR - Anemia Profile B  7. Overweight (BMI 25.0-29.9) - CMP14+EGFR  8. Upper airway cough syndrome - CMP14+EGFR  9. 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 Dysfunction of both eustachian tubes Start zyrtec, flonase and nasal decongestant - cetirizine (ZYRTEC ALLERGY) 10 MG tablet; Take 1 tablet (10 mg total) by mouth daily.  Dispense: 90 tablet; Refill: 1 - fluticasone (FLONASE) 50 MCG/ACT nasal spray; Place 2 sprays into both nostrils daily.  Dispense: 16 g; Refill: 6   Labs pending Health Maintenance reviewed Diet and exercise encouraged  Follow up plan: 6 months    [name], FNP

## 2023-07-31 LAB — ANEMIA PROFILE B
Basophils Absolute: 0.1 10*3/uL (ref 0.0–0.2)
Basos: 1 %
EOS (ABSOLUTE): 0.1 10*3/uL (ref 0.0–0.4)
Eos: 1 %
Ferritin: 32 ng/mL (ref 15–150)
Folate: 5.4 ng/mL (ref >3.0)
Hematocrit: 46.3 % (ref 34.0–46.6)
Hemoglobin: 15.4 g/dL (ref 11.1–15.9)
Immature Grans (Abs): 0 10*3/uL (ref 0.0–0.1)
Immature Granulocytes: 0 %
Iron Saturation: 33 % (ref 15–55)
Iron: 88 ug/dL (ref 27–159)
Lymphocytes Absolute: 2 10*3/uL (ref 0.7–3.1)
Lymphs: 25 %
MCH: 29.6 pg (ref 26.6–33.0)
MCHC: 33.3 g/dL (ref 31.5–35.7)
MCV: 89 fL (ref 79–97)
Monocytes Absolute: 0.5 10*3/uL (ref 0.1–0.9)
Monocytes: 7 %
Neutrophils Absolute: 5.1 10*3/uL (ref 1.4–7.0)
Neutrophils: 66 %
Platelets: 205 10*3/uL (ref 150–450)
RBC: 5.21 x10E6/uL (ref 3.77–5.28)
RDW: 13.5 % (ref 11.7–15.4)
Retic Ct Pct: 0.9 % (ref 0.6–2.6)
Total Iron Binding Capacity: 265 ug/dL (ref 250–450)
UIBC: 177 ug/dL (ref 131–425)
Vitamin B-12: 356 pg/mL (ref 232–1245)
WBC: 7.8 10*3/uL (ref 3.4–10.8)

## 2023-07-31 LAB — TSH: TSH: 2.14 u[IU]/mL (ref 0.450–4.500)

## 2023-07-31 LAB — CMP14+EGFR
ALT: 15 IU/L (ref 0–32)
AST: 20 IU/L (ref 0–40)
Albumin: 4.1 g/dL (ref 3.9–4.9)
Alkaline Phosphatase: 71 IU/L (ref 44–121)
BUN/Creatinine Ratio: 10 (ref 9–23)
BUN: 7 mg/dL (ref 6–20)
Bilirubin Total: 0.3 mg/dL (ref 0.0–1.2)
CO2: 21 mmol/L (ref 20–29)
Calcium: 8.9 mg/dL (ref 8.7–10.2)
Chloride: 106 mmol/L (ref 96–106)
Creatinine, Ser: 0.72 mg/dL (ref 0.57–1.00)
Globulin, Total: 1.9 g/dL (ref 1.5–4.5)
Glucose: 83 mg/dL (ref 70–99)
Potassium: 4.3 mmol/L (ref 3.5–5.2)
Sodium: 138 mmol/L (ref 134–144)
Total Protein: 6 g/dL (ref 6.0–8.5)
eGFR: 109 mL/min/{1.73_m2} (ref >59)

## 2023-07-31 LAB — LIPID PANEL
Chol/HDL Ratio: 5.4 ratio — ABNORMAL HIGH (ref 0.0–4.4)
Cholesterol, Total: 194 mg/dL (ref 100–199)
HDL: 36 mg/dL — ABNORMAL LOW (ref >39)
LDL Chol Calc (NIH): 139 mg/dL — ABNORMAL HIGH (ref 0–99)
Triglycerides: 105 mg/dL (ref 0–149)
VLDL Cholesterol Cal: 19 mg/dL (ref 5–40)

## 2023-08-02 LAB — CYTOLOGY - PAP: Diagnosis: NEGATIVE

## 2023-09-06 ENCOUNTER — Encounter: Payer: Self-pay | Admitting: Family

## 2023-09-06 ENCOUNTER — Telehealth (INDEPENDENT_AMBULATORY_CARE_PROVIDER_SITE_OTHER): Payer: 59 | Admitting: Family

## 2023-09-06 DIAGNOSIS — B9689 Other specified bacterial agents as the cause of diseases classified elsewhere: Secondary | ICD-10-CM | POA: Diagnosis not present

## 2023-09-06 DIAGNOSIS — J208 Acute bronchitis due to other specified organisms: Secondary | ICD-10-CM | POA: Diagnosis not present

## 2023-09-06 MED ORDER — PREDNISONE 10 MG (21) PO TBPK: ORAL_TABLET | ORAL | 0 refills | Status: DC

## 2023-09-06 MED ORDER — AZITHROMYCIN 250 MG PO TABS: ORAL_TABLET | ORAL | 0 refills | Status: DC

## 2023-09-06 MED ORDER — PROMETHAZINE-DM 6.25-15 MG/5ML PO SYRP: 5.0000 mL | ORAL_SOLUTION | Freq: Three times a day (TID) | ORAL | 0 refills | Status: DC | PRN

## 2023-09-06 MED ORDER — BENZONATATE 200 MG PO CAPS: 200.0000 mg | ORAL_CAPSULE | Freq: Three times a day (TID) | ORAL | 1 refills | Status: DC | PRN

## 2023-09-06 NOTE — Progress Notes (Signed)
Virtual Visit Consent   Mandy Garcia, you are scheduled for a virtual visit with a Mandy Garcia provider today. Just as with appointments in the office, your consent must be obtained to participate. Your consent will be active for this visit and any virtual visit you may have with one of our providers in the next 365 days. If you have a MyChart account, a copy of this consent can be sent to you electronically.  As this is a virtual visit, video technology does not allow for your provider to perform a traditional examination. This may limit your provider's ability to fully assess your condition. If your provider identifies any concerns that need to be evaluated in person or the need to arrange testing (such as labs, EKG, etc.), we will make arrangements to do so. Although advances in technology are sophisticated, we cannot ensure that it will always work on either your end or our end. If the connection with a video visit is poor, the visit may have to be switched to a telephone visit. With either a video or telephone visit, we are not always able to ensure that we have a secure connection.  By engaging in this virtual visit, you consent to the provision of healthcare and authorize for your insurance to be billed (if applicable) for the services provided during this visit. Depending on your insurance coverage, you may receive a charge related to this service.  I need to obtain your verbal consent now. Are you willing to proceed with your visit today? Mandy Garcia has provided verbal consent on 09/06/2023 for a virtual visit (video or telephone). Mandy Rodney, FNP  Date: 09/06/2023 4:42 PM  Virtual Visit via Video Note   I, Mandy Garcia, connected with  Mandy Garcia  ([id number], 39/06/1986) on 09/06/23 at  5:30 PM EDT by a video-enabled telemedicine application and verified that I am speaking with the correct person using two identifiers.  Location: Patient: Virtual Visit  Location Patient: Home Provider: Virtual Visit Location Provider: Office/Clinic   I discussed the limitations of evaluation and management by telemedicine and the availability of in person appointments. The patient expressed understanding and agreed to proceed.    History of Present Illness: Mandy Garcia is a 37 y.o. who identifies as a female who was assigned female at birth, and is being seen today for cough.  HPI: Cough This is a new problem. The current episode started 1 to 4 weeks ago. The problem has been unchanged. The problem occurs every few minutes. The cough is Productive of purulent sputum. Associated symptoms include ear congestion, headaches, myalgias, nasal congestion and wheezing. Pertinent negatives include no chills, ear pain, fever or shortness of breath. Risk factors for lung disease include smoking/tobacco exposure. She has tried rest and OTC cough suppressant for the symptoms. The treatment provided mild relief.    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 12/15/2022   Overweight (BMI 25.0-29.9) 03/07/2019   Postoperative numbness 08/01/2018   Constipation 09/27/2017   Hemorrhoids 09/27/2017   Dysmenorrhea 09/27/2017   Breast mass, right 09/29/2016   Cervical high risk HPV (human papillomavirus) test positive 09/28/2015   Cigarette smoker 09/24/2015   Syncope 05/02/2014   Iron deficiency anemia 03/14/2014    Allergies:  Allergies  Allergen Reactions   Doxycycline     Unknown reaction    Protonix [Pantoprazole] Nausea Only    Reported 01/29/2023 -  pt states that if she takes the medication before eating it causes nausea/vomiting   Tramadol     "hallucinations"   Medications:  Current Outpatient Medications:    azithromycin (ZITHROMAX) 250 MG tablet, Take 500 mg once, then 250 mg for four days, Disp: 6 tablet, Rfl: 0   benzonatate (TESSALON)  200 MG capsule, Take 1 capsule (200 mg total) by mouth 3 (three) times daily as needed., Disp: 30 capsule, Rfl: 1   predniSONE (STERAPRED UNI-PAK 21 TAB) 10 MG (21) TBPK tablet, Use as directed, Disp: 21 tablet, Rfl: 0   promethazine-dextromethorphan (PROMETHAZINE-DM) 6.25-15 MG/5ML syrup, Take 5 mLs by mouth 3 (three) times daily as needed for cough., Disp: 118 mL, Rfl: 0   acetaminophen (TYLENOL) 500 MG tablet, Take 1 tablet (500 mg total) by mouth every 6 (six) hours as needed., Disp: 30 tablet, Rfl: 0   cetirizine (ZYRTEC ALLERGY) 10 MG tablet, Take 1 tablet (10 mg total) by mouth daily., Disp: 90 tablet, Rfl: 1   famotidine (PEPCID) 20 MG tablet, Take 1 tablet (20 mg total) by mouth daily after supper., Disp: 30 tablet, Rfl: 11   fluticasone (FLONASE) 50 MCG/ACT nasal spray, Place 2 sprays into both nostrils daily., Disp: 16 g, Rfl: 6   hydrOXYzine (ATARAX) 25 MG tablet, Take 1 tablet (25 mg total) by mouth every 8 (eight) hours as needed. (Patient not taking: Reported on 07/30/2023), Disp: 90 tablet, Rfl: 2   RABEprazole (ACIPHEX) 20 MG tablet, Take 1 tablet (20 mg total) by mouth daily. Take 30-60 min before first meal of the day (Patient not taking: Reported on 07/30/2023), Disp: 30 tablet, Rfl: 11   triamcinolone ointment (KENALOG) 0.5 %, Apply 1 Application topically 2 (two) times daily., Disp: 60 g, Rfl: 2  Observations/Objective: Patient is well-developed, well-nourished in no acute distress.  Resting comfortably  at home.  Head is normocephalic, atraumatic.  No labored breathing.  Speech is clear and coherent with logical content.  Patient is alert and oriented at baseline.  Coarse cough  Assessment and Plan: 1. Acute bacterial bronchitis - azithromycin (ZITHROMAX) 250 MG tablet; Take 500 mg once, then 250 mg for four days  Dispense: 6 tablet; Refill: 0 - predniSONE (STERAPRED UNI-PAK 21 TAB) 10 MG (21) TBPK tablet; Use as directed  Dispense: 21 tablet; Refill: 0 - benzonatate  (TESSALON) 200 MG capsule; Take 1 capsule (200 mg total) by mouth 3 (three) times daily as needed.  Dispense: 30 capsule; Refill: 1 - promethazine-dextromethorphan (PROMETHAZINE-DM) 6.25-15 MG/5ML syrup; Take 5 mLs by mouth 3 (three) times daily as needed for cough.  Dispense: 118 mL; Refill: 0  - Take meds as prescribed - Use a cool mist humidifier  -Use saline nose sprays frequently -Force fluids -For any cough or congestion  Use plain Mucinex- regular strength or max strength is fine -For fever or aces or pains- take tylenol or ibuprofen. -Throat lozenges if help -Follow up if symptoms worsen or do not improve   Follow Up Instructions: I discussed the assessment and treatment plan with the patient. The patient was provided an opportunity to ask questions and all were answered. The patient agreed with the plan and demonstrated an understanding of the instructions.  A copy of instructions were sent to the patient via MyChart unless otherwise noted below.     The patient was advised to call back or seek an in-person evaluation if the symptoms worsen or if the condition fails to improve as anticipated.  Time:  I spent 6  minutes with the patient via telehealth technology discussing the above problems/concerns.    Mandy Rodney, FNP

## 2023-09-08 ENCOUNTER — Encounter (HOSPITAL_COMMUNITY): Payer: Self-pay | Admitting: Hematology and Oncology

## 2023-09-10 ENCOUNTER — Encounter: Payer: Self-pay | Admitting: Internal Medicine

## 2023-09-10 ENCOUNTER — Ambulatory Visit: Payer: 59 | Admitting: Internal Medicine

## 2023-09-10 VITALS — BP 130/84 | HR 82 | Ht 63.5 in | Wt 168.0 lb

## 2023-09-10 DIAGNOSIS — R058 Other specified cough: Secondary | ICD-10-CM | POA: Diagnosis not present

## 2023-09-10 DIAGNOSIS — F17210 Nicotine dependence, cigarettes, uncomplicated: Secondary | ICD-10-CM

## 2023-09-10 DIAGNOSIS — J479 Bronchiectasis, uncomplicated: Secondary | ICD-10-CM | POA: Diagnosis not present

## 2023-09-10 MED ORDER — AMOXICILLIN-POT CLAVULANATE 875-125 MG PO TABS: 1.0000 | ORAL_TABLET | Freq: Two times a day (BID) | ORAL | 0 refills | Status: DC

## 2023-09-10 NOTE — Assessment & Plan Note (Addendum)
Onset 08/2022 in active smoker with prominent pseudowheeze / bronchiecatasis on CT 10/30/22 s tracheal obst -  12/15/2022  rec augmentin x 10 da and f/u with sinus ct if not better -  12/15/2022  :  allergy screen Eos 0.1  IgE < 2  - 12/15/2022 rec max rx for GERD / pred x 6 days and f/u in 6 weeks > could not take protonix - 06/23/2023 trial of aciphex and pepcid and refer to ENT > she canceled due to transportation    >>> 09/10/2023 rx zpak then augmentin and if not better > Sinus CT   >>> can't tol PPI so rec bid pepcid pc

## 2023-09-10 NOTE — Assessment & Plan Note (Signed)
Counseled re importance of smoking cessation but did not meet time criteria for separate billing           Each maintenance medication was reviewed in detail including emphasizing most importantly the difference between maintenance and prns and under what circumstances the prns are to be triggered using an action plan format where appropriate.  Total time for H and P, chart review, counseling, reviewing hfa/neb device(s) and generating customized AVS unique to this office visit / same day charting > 30 min for   refractory respiratory  symptoms of uncertain etiology

## 2023-09-10 NOTE — Progress Notes (Signed)
Mandy Garcia, female    DOB: [birth year]    MRN: [id number]   Brief patient profile:  37  yowf  active smoker/MM with bad asthma as child outgrew completely  by age 37 able to do sports s inhalers  referred to pulmonary clinic in [place]  12/15/2022 by [name]  for sob/ cough abrupt onset 08/2022  abruptly with "typical URI for her" but dx as PNA (not covid testing per pt)  but did not resolve despite multiple antibiotics and prednisone.     History of Present Illness  12/15/2022  Pulmonary/ 1st office eval/ Fronnie Urton / [place] [facility]  Chief Complaint  Patient presents with   Consult    SOB and cough ILD/ pulm nodules   Dyspnea:  walking  75 yards to car/ flat  Cough: worse after supper which is her biggest meal / worst at hs then sleeps ok  Light green mucus  Sleep: flat bed/ 2 pillows  SABA use: not covinced it helps 02: none  Rec See Dentist as soon as you can - this is really important for your lung health. For cough > mucinex dm 1200 mg one every 12 hours as needed (OTC)  Prednisone 10 mg take  4 each am x 2 days,   2 each am x 2 days,  1 each am x 2 days and stop  Augmentin 875 mg take one pill twice daily  X 10 days -  For shortness > breztri 1-2 every hours or albuterol 2 puffs every 4 hours as needed Ok to try albuterol or breztri 15-30 min before an activity (on alternating days)  that you know would usually make you short of breath  Work on inhaler technique:   Pantoprazole (protonix) 40 mg   Take  30-60 min before first meal of the day and Pepcid (famotidine)  20 mg after supper until return to office GERD rx Please schedule a follow up office visit in 6 weeks, call sooner if needed with all medications /inhalers/ solutions in hand so we can verify exactly what you are taking. This includes all medications from all doctors and over the counters    05/12/2023  f/u ov/Ronie Fleeger re: uacs/ bronchiectasis maint on breztri one bid   Chief Complaint  Patient presents  with   Follow-up    Review PFT.  C/o L side painful with SOB x 6 weeks.  Some wheeze  Dyspnea:  across parking lot better Cough: at hs then resolves; no production Sleeping: 4 in bed blocks/ no resp cc / no am flare  SABA use: maybe one or twice  02: none  L flank pain worse with deep breathing/ coughing or rolling over on L side Rec The key is to stop smoking completely before smoking completely stops you! Change breztri to Take 2 puffs first thing in am and then another 2 puffs about 12 hours later.  Only use your albuterol as a rescue medication Also  Ok to try albuterol 15 min before an activity (on alternating days)  that you know would usually make you short of breath Please schedule a follow up office visit in 6 weeks, call sooner if needed with all medications /inhalers/ solutions in hand    06/23/2023  f/u ov/[place] [facility]/Mandolin Falwell re: uacs/ GOLD 3 copd/bronchiectasis  maint on Breztri  did not  bring meds - got symbicort (she thinks) but did not bring meds doing as well with doe as on Breztri /still smoking Chief Complaint  Patient presents with  Follow-up    Breathing is about the same. She uses the albuterol inhaler at least twice daily when she is at work. Breathing gets worse when out it the humid weather.  She has a dry cough in the am.   Dyspnea:  thinks this was better with breztri than symbicort Cough: min in am some congestion > no production p 5 min Sleeping: 4 in bed blocks no resp cc  SABA use: 1-2 x times usually only after ex  02: none rec  Aciphex 20  mg   Take  30-60 min before first meal of the day and Pepcid (famotidine)  20 mg after supper until return to office    referral to ENT > did not have a ride so canceled  Work on inhaler technique:  Only use your albuterol as a rescue medication   Also  Ok to try albuterol 15 min before an activity (on alternating days)  that you know would usually make you short of breath      The key is to stop smoking  completely before smoking completely stops you!    09/10/2023 3 m  f/u ov/[place] office/Azlaan Isidore re: sinusitis/ bronchiectasis maint on pepcid 20 mg after supper and day 3 rx  zpak/prednisone /zyrtec per PCP for sinusitis  Chief Complaint  Patient presents with   Upper airway cough syndrome   Dyspnea:  not a problem  Cough: mainly at hs and early in am=  light green  Sleeping: bed blocks and 2 pillows most nights sleep thru it  SABA use: none / did not think any inalers including breztri helped any of her symptoms  02: none  Gen ant chest discomfort  since harsh  coughing/ only has with vigorous cough but can only take rob dm     No obvious day to day or daytime variability or assoc  mucus plugs or hemoptysis or  chest tightness, subjective wheeze or overt sinus or hb symptoms.    Also denies any obvious fluctuation of symptoms with weather or environmental changes or other aggravating or alleviating factors except as outlined above   No unusual exposure hx or h/o childhood pna or knowledge of premature birth.  Current Allergies, Complete Past Medical History, Past Surgical History, Family History, and Social History were reviewed in [employer] record.  ROS  The following are not active complaints unless bolded Hoarseness, sore throat, dysphagia, dental problems, itching, sneezing,  nasal congestion or discharge of excess mucus or purulent secretions, ear ache,   fever, chills, sweats, unintended wt loss or wt gain, classically pleuritic or exertional cp,  orthopnea pnd or arm/hand swelling  or leg swelling, presyncope, palpitations, abdominal pain, anorexia, nausea, vomiting, diarrhea  or change in bowel habits or change in bladder habits, change in stools or change in urine, dysuria, hematuria,  rash, arthralgias, visual complaints, headache, numbness, weakness or ataxia or problems with walking or coordination,  change in mood or  memory.        Current Meds   Medication Sig   acetaminophen (TYLENOL) 500 MG tablet Take 1 tablet (500 mg total) by mouth every 6 (six) hours as needed.   azithromycin (ZITHROMAX) 250 MG tablet Take 500 mg once, then 250 mg for four days   benzonatate (TESSALON) 200 MG capsule Take 1 capsule (200 mg total) by mouth 3 (three) times daily as needed.   cetirizine (ZYRTEC ALLERGY) 10 MG tablet Take 1 tablet (10 mg total) by mouth daily.   famotidine (  PEPCID) 20 MG tablet Take 1 tablet (20 mg total) by mouth daily after supper.   fluticasone (FLONASE) 50 MCG/ACT nasal spray Place 2 sprays into both nostrils daily.   predniSONE (STERAPRED UNI-PAK 21 TAB) 10 MG (21) TBPK tablet Use as directed   promethazine-dextromethorphan (PROMETHAZINE-DM) 6.25-15 MG/5ML syrup Take 5 mLs by mouth 3 (three) times daily as needed for cough.   triamcinolone ointment (KENALOG) 0.5 % Apply 1 Application topically 2 (two) times daily.               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    Mental disorder    Shoulder pain         Objective:    Wts    09/10/2023       168  06/23/2023       173  05/12/2023       179  01/29/2023       194  12/15/22 204 lb (92.5 kg)  10/30/22 201 lb 3.2 oz (91.3 kg)  10/02/22 194 lb (88 kg)       Vital signs reviewed  09/10/2023  - Note at rest 02 sats  96% on RA   General appearance:    amb mod obese wf/ no spont cough     HEENT : Oropharynx  clear   Nasal turbinates nl    NECK :  without  apparent JVD/ palpable Nodes/TM    LUNGS: no acc muscle use,  Min barrel  contour chest wall with bilateral  minimal insp /exp rhonchi  and  without cough on insp or exp maneuvers and min  Hyperresonant  to  percussion bilaterally    CV:  RRR  no s3 or murmur or increase in P2, and no edema   ABD: obese  soft and nontender    MS:  Nl gait/ ext warm without deformities Or obvious joint restrictions  calf tenderness, cyanosis or clubbing     SKIN:  warm and dry without lesions    NEURO:  alert, approp, nl sensorium with  no motor or cerebellar deficits apparent.                    Assessment

## 2023-09-10 NOTE — Assessment & Plan Note (Addendum)
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  -  12/15/2022  :  allergy screen Eos 0.1  IgE < 2  alpha one AT phenotype  MM level 156  - 01/29/2023  After extensive coaching inhaler device,  effectiveness =   75% (short ti) continue breztri 1-2 bid  - 05/12/2023  After extensive coaching inhaler device,  effectiveness =  75% ( short ti/ poor trigger at onset of insp)  - PFT's  05/12/2023   FEV1 1.18 (39 % ) ratio 0.59  p 18 % improvement from saba p 0 prior to study with DLCO  14.64 (67%)   and FV curve abnormal in effort dep portion and ERV 25%  at wt 178   - 05/12/2023  quant Igs/ [facility] serology nl - 06/23/2023  After extensive coaching inhaler device,  effectiveness =    60% (short Ti, variable flow)  She's not convinced any form of inhaler or neb really helping so ok to leave off maint rx for now

## 2023-09-10 NOTE — Patient Instructions (Addendum)
At 10 days if mucus still nasty take augmentin twice daily x 10 days and if still having nasal congestion and sinus pain and no more nasty mucus > if still better not better call me for Sinus  CT or see ENT   Change pepcid to 20 mg twice daily = >> one after breakfast and one after supper   The key is to stop smoking completely before smoking completely stops you!   Please schedule a follow up office visit in 6 weeks, call sooner if needed

## 2023-09-21 ENCOUNTER — Other Ambulatory Visit: Payer: Self-pay

## 2023-09-21 ENCOUNTER — Telehealth: Payer: Self-pay | Admitting: Internal Medicine

## 2023-09-21 DIAGNOSIS — R053 Chronic cough: Secondary | ICD-10-CM

## 2023-09-21 NOTE — Telephone Encounter (Signed)
Ok to do sinus ct dx chronic cough

## 2023-09-21 NOTE — Telephone Encounter (Signed)
Pt called back said that she would to go ahead w/  ct scan of her sinus as you have discuss at  last ov on 09/10/23, she is still having the sinus pressure.

## 2023-09-21 NOTE — Telephone Encounter (Signed)
Placed order for ct scan sinus w/o  for patient , called to let pt know that order was placed and someone will her with appt.

## 2023-09-21 NOTE — Telephone Encounter (Signed)
Patient would like to schedule CT scan. Patient phone number is [phone].

## 2023-10-21 NOTE — Progress Notes (Unsigned)
Mandy [facility], female    DOB: [birth year]    MRN: [id number]   Brief patient profile:  47  yowf  active smoker/MM with bad asthma as child outgrew completely  by age 37 able to do sports s inhalers  referred to pulmonary clinic in [name]  12/15/2022 by Mandy Garcia  for sob/ cough abrupt onset 08/2022  abruptly with "typical URI for her" but dx as PNA (not covid testing per pt)  but did not resolve despite multiple antibiotics and prednisone.     History of Present Illness  12/15/2022  Pulmonary/ 1st office eval/ Mandy Garcia / [name]  Chief Complaint  Patient presents with   Consult    SOB and cough ILD/ pulm nodules   Dyspnea:  walking  75 yards to car/ flat  Cough: worse after supper which is her biggest meal / worst at hs then sleeps ok  Light green mucus  Sleep: flat bed/ 2 pillows  SABA use: not covinced it helps 02: none  Rec See Dentist as soon as you can - this is really important for your lung health. For cough > mucinex dm 1200 mg one every 12 hours as needed (OTC)  Prednisone 10 mg take  4 each am x 2 days,   2 each am x 2 days,  1 each am x 2 days and stop  Augmentin 875 mg take one pill twice daily  X 10 days -  For shortness > breztri 1-2 every hours or albuterol 2 puffs every 4 hours as needed Ok to try albuterol or breztri 15-30 min before an activity (on alternating days)  that you know would usually make you short of breath  Work on inhaler technique:   Pantoprazole (protonix) 40 mg   Take  30-60 min before first meal of the day and Pepcid (famotidine)  20 mg after supper until return to office GERD rx Please schedule a follow up office visit in 6 weeks, call sooner if needed with all medications /inhalers/ solutions in hand so we can verify exactly what you are taking. This includes all medications from all doctors and over the counters    05/12/2023  f/u ov/Mandy [facility] re: uacs/ bronchiectasis maint on breztri one bid   Chief Complaint  Patient presents  with   Follow-up    Review PFT.  C/o L side painful with SOB x 6 weeks.  Some wheeze  Dyspnea:  across parking lot better Cough: at hs then resolves; no production Sleeping: 4 in bed blocks/ no resp cc / no am flare  SABA use: maybe one or twice  02: none  L flank pain worse with deep breathing/ coughing or rolling over on L side Rec The key is to stop smoking completely before smoking completely stops you! Change breztri to Take 2 puffs first thing in am and then another 2 puffs about 12 hours later.  Only use your albuterol as a rescue medication Also  Ok to try albuterol 15 min before an activity (on alternating days)  that you know would usually make you short of breath Please schedule a follow up office visit in 6 weeks, call sooner if needed with all medications /inhalers/ solutions in hand    06/23/2023  f/u ov/[facility]/Mandy [facility] re: uacs/ GOLD 3 copd/bronchiectasis  maint on Breztri  did not  bring meds - got symbicort (she thinks) but did not bring meds doing as well with doe as on Breztri /still smoking Chief Complaint  Patient presents with  Follow-up    Breathing is about the same. She uses the albuterol inhaler at least twice daily when she is at work. Breathing gets worse when out it the humid weather.  She has a dry cough in the am.   Dyspnea:  thinks this was better with breztri than symbicort Cough: min in am some congestion > no production p 5 min Sleeping: 4 in bed blocks no resp cc  SABA use: 1-2 x times usually only after ex  02: none rec  Aciphex 20  mg   Take  30-60 min before first meal of the day and Pepcid (famotidine)  20 mg after supper until return to office    referral to ENT > did not have a ride so canceled  Work on inhaler technique:  Only use your albuterol as a rescue medication   Also  Ok to try albuterol 15 min before an activity (on alternating days)  that you know would usually make you short of breath      The key is to stop smoking  completely before smoking completely stops you!    09/10/2023 3 m  f/u ov/[facility] office/Mandy Garcia re: sinusitis/ bronchiectasis maint on pepcid 20 mg after supper and day 3 rx  zpak/prednisone /zyrtec per PCP for sinusitis  Chief Complaint  Patient presents with   Upper airway cough syndrome   Dyspnea:  not a problem  Cough: mainly at hs and early in am=  light green  Sleeping: bed blocks and 2 pillows most nights sleep thru it  SABA use: none / did not think any inalers including breztri helped any of her symptoms  02: none  Gen ant chest discomfort  since harsh  coughing/ only has with vigorous cough but can only take rob dm   Rec At 10 days if mucus still nasty take augmentin twice daily x 10 days and if still having nasal congestion and sinus pain and no more nasty mucus > if still better not better call me for Sinus  CT or see ENT  Change pepcid to 20 mg twice daily = >> one after breakfast and one after supper   The key is to stop smoking completely before smoking completely stops you!  Augmentin 09/10/23    10/22/2023  f/u ov/[name]/Mandy Garcia re:  uacs/ GOLD 3 copd/bronchiectasis /sinusitis  maint on pepcid 20 mg bid  / sinus ct 10/29/23 pending / still smoking  Chief Complaint  Patient presents with   Cough  Dyspnea:  Not limited by breathing from desired activities  including yardwork Cough: 50% better but p head hits pillow coughs x 10 min still light green then again after stir in am  Sleeping: bed s noct   resp cc  SABA use: none  02: none    No obvious day to day or daytime variability or assoc mucus plugs or hemoptysis or cp or chest tightness, subjective wheeze or overt sinus or hb symptoms.    Also denies any obvious fluctuation of symptoms with weather or environmental changes or other aggravating or alleviating factors except as outlined above   No unusual exposure hx or h/o childhood pna/  knowledge of premature birth.  Current Allergies, Complete Past  Medical History, Past Surgical History, Family History, and Social History were reviewed in [employer] record.  ROS  The following are not active complaints unless bolded Hoarseness, sore throat, dysphagia= globus, dental problems, itching, sneezing,  nasal congestion or discharge of excess mucus or purulent  secretions, ear ache,   fever, chills, sweats, unintended wt loss or wt gain, classically pleuritic or exertional cp,  orthopnea pnd or arm/hand swelling  or leg swelling, presyncope, palpitations, abdominal pain, anorexia, nausea, vomiting, diarrhea  or change in bowel habits or change in bladder habits, change in stools or change in urine, dysuria, hematuria,  rash, arthralgias, visual complaints, headache, numbness, weakness or ataxia or problems with walking or coordination,  change in mood or  memory.        Current Meds  Medication Sig   acetaminophen (TYLENOL) 500 MG tablet Take 1 tablet (500 mg total) by mouth every 6 (six) hours as needed.   cetirizine (ZYRTEC ALLERGY) 10 MG tablet Take 1 tablet (10 mg total) by mouth daily.   famotidine (PEPCID) 20 MG tablet Take 1 tablet (20 mg total) by mouth daily after supper.   fluticasone (FLONASE) 50 MCG/ACT nasal spray Place 2 sprays into both nostrils daily.   triamcinolone ointment (KENALOG) 0.5 % Apply 1 Application topically 2 (two) times daily.             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    Mental disorder    Shoulder pain         Objective:    Wts    10/22/2023       168   09/10/2023       168  06/23/2023       173  05/12/2023       179  01/29/2023       194  12/15/22 204 lb (92.5 kg)  10/30/22 201 lb 3.2 oz (91.3 kg)  10/02/22 194 lb (88 kg)       Vital signs reviewed  10/22/2023  - Note at rest 02 sats  96% on RA   General appearance:    amb wf/ poor dentition / cough early on insp     HEENT : Oropharynx  clear / poor dentition    Nasal turbinates mod edema / no polyps   NECK :  without  apparent JVD/ palpable Nodes/TM    LUNGS: no acc muscle use,  Mild barrel  contour chest wall with bilateral scattered ins squeaks     CV:  RRR  no s3 or murmur or increase in P2, and no edema   ABD:  soft and nontender    MS:  Nl gait/ ext warm without deformities Or obvious joint restrictions  calf tenderness, cyanosis or clubbing     SKIN: warm and dry without lesions    NEURO:  alert, approp, nl sensorium with  no motor or cerebellar deficits apparent.              Assessment

## 2023-10-22 ENCOUNTER — Ambulatory Visit: Payer: 59 | Admitting: Internal Medicine

## 2023-10-22 ENCOUNTER — Other Ambulatory Visit: Payer: Self-pay

## 2023-10-22 ENCOUNTER — Encounter: Payer: Self-pay | Admitting: Internal Medicine

## 2023-10-22 VITALS — BP 132/91 | HR 67 | Ht 63.5 in | Wt 168.0 lb

## 2023-10-22 DIAGNOSIS — J479 Bronchiectasis, uncomplicated: Secondary | ICD-10-CM

## 2023-10-22 DIAGNOSIS — R058 Other specified cough: Secondary | ICD-10-CM

## 2023-10-22 DIAGNOSIS — F17210 Nicotine dependence, cigarettes, uncomplicated: Secondary | ICD-10-CM

## 2023-10-22 MED ORDER — AMOXICILLIN-POT CLAVULANATE 875-125 MG PO TABS: 1.0000 | ORAL_TABLET | Freq: Two times a day (BID) | ORAL | 0 refills | Status: DC

## 2023-10-22 NOTE — Patient Instructions (Addendum)
Augmentin 875 mg take one pill twice daily  X 10 days - take at breakfast and supper with large glass of water.  It would help reduce the usual side effects (diarrhea and yeast infections) if you ate cultured yogurt at lunch.   For drainage / throat tickle try take CHLORPHENIRAMINE  4 mg  ("Allergy Relief" 4mg   at [facility] should be easiest to find in the blue box usually on bottom shelf)   - extremely effective and inexpensive over the counter- may cause drowsiness so start with just a dose or two an hour before bedtime and see how you tolerate it before trying in daytime.   Keep your appt for sinus CT and I will call you with result   Continue zytrec in am   The key is to stop smoking completely before smoking completely stops you!     Please schedule a follow up office visit in 6 weeks, call sooner if needed

## 2023-10-23 NOTE — Assessment & Plan Note (Signed)
Onset 08/2022 in active smoker with prominent pseudowheeze / bronchiecatasis on CT 10/30/22 s tracheal obst -  12/15/2022  rec augmentin x 10 da and f/u with sinus ct if not better -  12/15/2022  :  allergy screen Eos 0.1  IgE < 2  - 12/15/2022 rec max rx for GERD / pred x 6 days and f/u in 6 weeks > could not take protonix - 06/23/2023 trial of aciphex and pepcid and refer to ENT > she canceled due to transportation  - 09/10/2023 rx zpak then augmentin and if not better > Sinus CT planned for 10/29/23 and ent f/u prn  - 10/22/2023 added 1st gen H1 blockers per guidelines  at hs and continue zyrtec in am   Main issue is noct cough which may be due to pnds > best choice rx  is  1st gen H1 blockers per guidelines  pending sinus ct/ ent f/u with one more round of augentin in case of peristent sinusitis but no further abx unless more evidence of actual infection

## 2023-10-23 NOTE — Assessment & Plan Note (Signed)
4-5 min discussion re active cigarette smoking in addition to office E&M  Ask about tobacco use:   ongoing  Advise quitting   this should have immediate impact on chronic cough / improving mucociiliary function and preserving lung function unlike anything else we have to offer here Assess willingness:  Not committed at this point Assist in quit attempt:  Per PCP when ready Arrange follow up:   Follow up per Primary Care planned

## 2023-10-23 NOTE — Assessment & Plan Note (Addendum)
Onset of symptoms p "tyical URI " in Sept 2023 but never really quite right since 1st covid/paxllovide -  see CT 10/2022 with mostly traction bronchiectasis  -  12/15/2022  :  allergy screen Eos 0.1  IgE < 2  alpha one AT phenotype  MM level 156  - 01/29/2023  After extensive coaching inhaler device,  effectiveness =   75% (short ti) continue breztri 1-2 bid  - 05/12/2023  After extensive coaching inhaler device,  effectiveness =  75% ( short ti/ poor trigger at onset of insp)  - PFT's  05/12/2023   FEV1 1.18 (39 % ) ratio 0.59  p 18 % improvement from saba p 0 prior to study with DLCO  14.64 (67%)   and FV curve abnormal in effort dep portion and ERV 25%  at wt 178   - 05/12/2023  quant Igs/ [facility] serology nl - 06/23/2023  After extensive coaching inhaler device,  effectiveness =    60%(short Ti, variable flow)  Rx augmentin bid x 10 d / interesting does not seem to benefit from/ need bronchodilators          Each maintenance medication was reviewed in detail including emphasizing most importantly the difference between maintenance and prns and under what circumstances the prns are to be triggered using an action plan format where appropriate.  Total time for H and P, chart review, counseling,  and generating customized AVS unique to this office visit / same day charting > 30 min for    refractory respiratory  symptoms of uncertain etiology

## 2023-10-29 ENCOUNTER — Ambulatory Visit
Admission: RE | Admit: 2023-10-29 | Discharge: 2023-10-29 | Disposition: A | Discharge status: Home / Self Care | Payer: 59 | Source: Ambulatory Visit | Attending: Internal Medicine | Admitting: Internal Medicine

## 2023-10-29 ENCOUNTER — Other Ambulatory Visit (HOSPITAL_COMMUNITY): Payer: 59

## 2023-10-29 DIAGNOSIS — R053 Chronic cough: Secondary | ICD-10-CM

## 2023-10-29 DIAGNOSIS — J342 Deviated nasal septum: Secondary | ICD-10-CM | POA: Diagnosis not present

## 2023-11-26 ENCOUNTER — Encounter (HOSPITAL_COMMUNITY): Payer: Self-pay | Admitting: Hematology and Oncology

## 2023-12-03 ENCOUNTER — Encounter: Payer: Self-pay | Admitting: Internal Medicine

## 2023-12-03 ENCOUNTER — Ambulatory Visit (INDEPENDENT_AMBULATORY_CARE_PROVIDER_SITE_OTHER): Payer: 59 | Admitting: Internal Medicine

## 2023-12-03 VITALS — BP 119/79 | HR 75 | Ht 63.5 in | Wt 167.0 lb

## 2023-12-03 DIAGNOSIS — R058 Other specified cough: Secondary | ICD-10-CM

## 2023-12-03 DIAGNOSIS — J479 Bronchiectasis, uncomplicated: Secondary | ICD-10-CM | POA: Diagnosis not present

## 2023-12-03 DIAGNOSIS — F17210 Nicotine dependence, cigarettes, uncomplicated: Secondary | ICD-10-CM

## 2023-12-03 NOTE — Progress Notes (Signed)
Mandy Garcia, female    DOB: 01-11-1986    MRN: [id number]   Brief patient profile:  59  yowf  active smoker/MM with bad asthma as child outgrew completely  by age 37 able to do sports s inhalers  referred to pulmonary clinic in [place]  12/15/2022 by Mandy Garcia  for sob/ cough abrupt onset 08/2022  abruptly with "typical URI for her" but dx as PNA (not covid testing per pt)  but did not resolve despite multiple antibiotics and prednisone.     History of Present Illness  12/15/2022  Pulmonary/ 1st [facility] eval/ Mandy Garcia / [place] [facility]  Chief Complaint  Patient presents with   Consult    SOB and cough ILD/ pulm nodules   Dyspnea:  walking  75 yards to car/ flat  Cough: worse after supper which is her biggest meal / worst at hs then sleeps ok  Light green mucus  Sleep: flat bed/ 2 pillows  SABA use: not covinced it helps 02: none  Rec See Dentist as soon as you can - this is really important for your lung health. For cough > mucinex dm 1200 mg one every 12 hours as needed (OTC)  Prednisone 10 mg take  4 each am x 2 days,   2 each am x 2 days,  1 each am x 2 days and stop  Augmentin 875 mg take one pill twice daily  X 10 days -  For shortness > breztri 1-2 every hours or albuterol 2 puffs every 4 hours as needed Ok to try albuterol or breztri 15-30 min before an activity (on alternating days)  that you know would usually make you short of breath  Work on inhaler technique:   Pantoprazole (protonix) 40 mg   Take  30-60 min before first meal of the day and Pepcid (famotidine)  20 mg after supper until return to [facility] GERD rx Please schedule a follow up [facility] visit in 6 weeks, call sooner if needed with all medications /inhalers/ solutions in hand so we can verify exactly what you are taking. This includes all medications from all doctors and over the counters    05/12/2023  f/u ov/Mandy Garcia re: uacs/ bronchiectasis maint on breztri one bid   Chief Complaint  Patient presents  with   Follow-up    Review PFT.  C/o L side painful with SOB x 6 weeks.  Some wheeze  Dyspnea:  across parking lot better Cough: at hs then resolves; no production Sleeping: 4 in bed blocks/ no resp cc / no am flare  SABA use: maybe one or twice  02: none  L flank pain worse with deep breathing/ coughing or rolling over on L side Rec The key is to stop smoking completely before smoking completely stops you! Change breztri to Take 2 puffs first thing in am and then another 2 puffs about 12 hours later.  Only use your albuterol as a rescue medication Also  Ok to try albuterol 15 min before an activity (on alternating days)  that you know would usually make you short of breath Please schedule a follow up [facility] visit in 6 weeks, call sooner if needed with all medications /inhalers/ solutions in hand    06/23/2023  f/u ov/Mandy Garcia/Mandy [facility] re: uacs/ GOLD 3 copd/bronchiectasis  maint on Breztri  did not  bring meds - got symbicort (she thinks) but did not bring meds doing as well with doe as on Breztri /still smoking Chief Complaint  Patient presents with  Follow-up    Breathing is about the same. She uses the albuterol inhaler at least twice daily when she is at work. Breathing gets worse when out it the humid weather.  She has a dry cough in the am.   Dyspnea:  thinks this was better with breztri than symbicort Cough: min in am some congestion > no production p 5 min Sleeping: 4 in bed blocks no resp cc  SABA use: 1-2 x times usually only after ex  02: none rec  Aciphex 20  mg   Take  30-60 min before first meal of the day and Pepcid (famotidine)  20 mg after supper until return to [facility]    referral to ENT > did not have a ride so canceled  Work on inhaler technique:  Only use your albuterol as a rescue medication   Also  Ok to try albuterol 15 min before an activity (on alternating days)  that you know would usually make you short of breath      The key is to stop smoking  completely before smoking completely stops you!    09/10/2023 3 m  f/u ov/[place] [facility]/Mandy Garcia re: sinusitis/ bronchiectasis maint on pepcid 20 mg after supper and day 3 rx  zpak/prednisone /zyrtec per PCP for sinusitis  Chief Complaint  Patient presents with   Upper airway cough syndrome   Dyspnea:  not a problem  Cough: mainly at hs and early in am=  light green  Sleeping: bed blocks and 2 pillows most nights sleep thru it  SABA use: none / did not think any inalers including breztri helped any of her symptoms  02: none  Gen ant chest discomfort  since harsh  coughing/ only has with vigorous cough but can only take rob dm   Rec At 10 days if mucus still nasty take augmentin twice daily x 10 days and if still having nasal congestion and sinus pain and no more nasty mucus > if still better not better call me for Sinus  CT or see ENT  Change pepcid to 20 mg twice daily = >> one after breakfast and one after supper   The key is to stop smoking completely before smoking completely stops you!  Augmentin 09/10/23    10/22/2023  f/u ov/[facility]/Mandy Garcia re:  uacs/ GOLD 3 copd/bronchiectasis /sinusitis  maint on pepcid 20 mg bid  / sinus ct 10/29/23 pending / still smoking  Chief Complaint  Patient presents with   Cough  Dyspnea:  Not limited by breathing from desired activities  including yardwork Cough: 50% better but p head hits pillow coughs x 10 min still light green then again after stir in am  Sleeping: bed s noct   resp cc  SABA use: none  02: none  Rec Augmentin 875 mg take one pill twice daily  X 10 days   For drainage / throat tickle try take CHLORPHENIRAMINE  4 mg    Continue zytrec in am  The key is to stop smoking completely before smoking completely stops you!   Sinus CT 10/29/23 neg x for L dev septum   12/03/2023  f/u ov/[facility]/Mandy [facility] re: UACS  maint on flonase/ pepcid   Chief Complaint  Patient presents with   Cough    Dyspnea:  walking 2 miles  per day including steps  Cough: minimal / assoc with nasal congestion pepcid 20 mg bid  Sleeping: electric 10 degrees s    resp cc  SABA use: none  02: none     No obvious day to day or daytime variability or assoc excess/ purulent sputum or mucus plugs or hemoptysis or cp or chest tightness, subjective wheeze or overt sinus or hb symptoms.    Also denies any obvious fluctuation of symptoms with weather or environmental changes or other aggravating or alleviating factors except as outlined above   No unusual exposure hx or h/o childhood pna  or knowledge of premature birth.  Current Allergies, Complete Past Medical History, Past Surgical History, Family History, and Social History were reviewed in [employer] record.  ROS  The following are not active complaints unless bolded Hoarseness, sore throat, dysphagia, dental problems, itching, sneezing,  nasal congestion or discharge of excess mucus or purulent secretions, ear ache,   fever, chills, sweats, unintended wt loss or wt gain, classically pleuritic or exertional cp,  orthopnea pnd or arm/hand swelling  or leg swelling, presyncope, palpitations, abdominal pain, anorexia, nausea, vomiting, diarrhea  or change in bowel habits or change in bladder habits, change in stools or change in urine, dysuria, hematuria,  rash, arthralgias, visual complaints, headache, numbness, weakness or ataxia or problems with walking or coordination,  change in mood or  memory.        Current Meds  Medication Sig   acetaminophen (TYLENOL) 500 MG tablet Take 1 tablet (500 mg total) by mouth every 6 (six) hours as needed.   cetirizine (ZYRTEC ALLERGY) 10 MG tablet Take 1 tablet (10 mg total) by mouth daily.   famotidine (PEPCID) 20 MG tablet Take 1 tablet (20 mg total) by mouth daily after supper.   fluticasone (FLONASE) 50 MCG/ACT nasal spray Place 2 sprays into both nostrils daily.   triamcinolone ointment (KENALOG) 0.5 % Apply 1  Application topically 2 (two) times daily.               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    Mental disorder    Shoulder pain         Objective:    Wts   12/03/2023    167  10/22/2023       168   09/10/2023       168  06/23/2023       173  05/12/2023       179  01/29/2023       194  12/15/22 204 lb (92.5 kg)  10/30/22 201 lb 3.2 oz (91.3 kg)  10/02/22 194 lb (88 kg)    Vital signs reviewed  12/03/2023  - Note at rest 02 sats  96% on RA   General appearance:    somber amb wf and        HEENT : Oropharynx  clear   Nasal turbinates mod edema/ mucoid secretions L >R swelling    NECK :  without  apparent JVD/ palpable Nodes/TM    LUNGS: no acc muscle use,  Min barrel  contour chest wall with bilateral  slightly decreased BS with a rew insp squeaks in bases and  without cough on insp or exp maneuvers and min  Hyperresonant  to  percussion bilaterally    CV:  RRR  no s3 or murmur or increase in P2, and no edema   ABD:  soft and nontender with pos end  insp Hoover's  in the supine position.  No bruits or organomegaly appreciated   MS:  Nl gait/ ext warm without deformities Or obvious joint restrictions  calf tenderness, cyanosis or clubbing     SKIN: warm and dry without lesions    NEURO:  alert, approp, nl sensorium with  no motor or cerebellar deficits apparent.                    Assessment

## 2023-12-03 NOTE — Assessment & Plan Note (Addendum)
Counseled re importance of smoking cessation but did not meet time criteria for separate billing     Each maintenance medication was reviewed in detail including emphasizing most importantly the difference between maintenance and prns and under what circumstances the prns are to be triggered using an action plan format where appropriate.  Total time for H and P, chart review, counseling, reviewing nasal device(s) and generating customized AVS unique to this office visit / same day charting = 30 min summary final f/u  ov

## 2023-12-03 NOTE — Assessment & Plan Note (Signed)
Onset 08/2022 in active smoker with prominent pseudowheeze / bronchiecatasis on CT 10/30/22 s tracheal obst -  12/15/2022  rec augmentin x 10 da and f/u with sinus ct if not better -  12/15/2022  :  allergy screen Eos 0.1  IgE < 2  - 12/15/2022 rec max rx for GERD / pred x 6 days and f/u in 6 weeks > could not take protonix - 06/23/2023 trial of aciphex and pepcid and refer to ENT > she canceled due to transportation  - 09/10/2023 rx zpak then augmentin and if not better > Sinus CT planned for 10/29/23 and ent f/u prn  - 10/22/2023 added 1st gen H1 blockers per guidelines  at [facility] and continue zyrtec in am  - Sinus CT 10/29/23 neg x for L dev septum  Advised on approp use of flonase/ afrin with ent f/u next if not improved.

## 2023-12-03 NOTE — Assessment & Plan Note (Signed)
Onset of symptoms p "tyical URI " in Sept 2023 but never really quite right since 1st covid/paxllovide -  see CT 10/2022 with mostly traction bronchiectasis  -  12/15/2022  :  allergy screen Eos 0.1  IgE < 2  alpha one AT phenotype  MM level 156  - 01/29/2023  After extensive coaching inhaler device,  effectiveness =   75% (short ti) continue breztri 1-2 bid  - 05/12/2023  After extensive coaching inhaler device,  effectiveness =  75% ( short ti/ poor trigger at onset of insp)  - PFT's  05/12/2023   FEV1 1.18 (39 % ) ratio 0.59  p 18 % improvement from saba p 0 prior to study with DLCO  14.64 (67%)   and FV curve abnormal in effort dep portion and ERV 25%  at wt 178   - 05/12/2023  quant Igs/ [facility] serology nl - 06/23/2023  After extensive coaching inhaler device,  effectiveness =    60%(short Ti, variable flow)  Strongly doubt significant airflow obst but she doe has mild bronchiectasis and rhinitis from smoking so rec d/c cigs, use otc's prn and f/u with allergy in fall if needed and ent in meantime as well   Pulmonary f/u is prn with all meds in hand using a trust but verify approach to confirm accurate Medication  Reconciliation The principal here is that until we are certain that the  patients are doing what we've asked, it makes no sense to ask them to do more.

## 2023-12-03 NOTE — Patient Instructions (Addendum)
I emphasized that nasal steroids(flonase)  have no immediate benefit in terms of improving symptoms.  To help them reached the target tissue, the patient should use Afrin one or two puffs every 12 hours applied one min before using the nasal steroids.  Afrin should be stopped after no more than 5 days.  If the symptoms worsen, Afrin can be restarted after 5 days off of therapy to prevent rebound congestion from overuse of Afrin.  I also emphasized that in no way are nasal steroids a concern in terms of "addiction".   Pepcid  (famotidine) can be taken as needed for acid indigestion or cough   The key is to stop smoking completely before smoking completely stops you!    Pulmonary follow up is as needed but bring all your medications

## 2024-01-10 ENCOUNTER — Encounter (HOSPITAL_COMMUNITY): Payer: Self-pay | Admitting: Hematology and Oncology

## 2024-01-21 ENCOUNTER — Encounter (HOSPITAL_COMMUNITY): Payer: Self-pay | Admitting: Hematology and Oncology

## 2024-01-26 ENCOUNTER — Other Ambulatory Visit: Payer: Self-pay | Admitting: Family

## 2024-01-26 DIAGNOSIS — H6993 Unspecified Eustachian tube disorder, bilateral: Secondary | ICD-10-CM

## 2024-01-28 ENCOUNTER — Ambulatory Visit: Payer: 59 | Admitting: Family

## 2024-02-25 ENCOUNTER — Ambulatory Visit (INDEPENDENT_AMBULATORY_CARE_PROVIDER_SITE_OTHER): Payer: 59 | Admitting: Family

## 2024-02-25 ENCOUNTER — Encounter: Payer: Self-pay | Admitting: Family

## 2024-02-25 ENCOUNTER — Ambulatory Visit (INDEPENDENT_AMBULATORY_CARE_PROVIDER_SITE_OTHER)

## 2024-02-25 VITALS — BP 116/82 | HR 90 | Temp 98.2°F | Ht 63.5 in | Wt 167.0 lb

## 2024-02-25 DIAGNOSIS — M5442 Lumbago with sciatica, left side: Secondary | ICD-10-CM

## 2024-02-25 DIAGNOSIS — F17210 Nicotine dependence, cigarettes, uncomplicated: Secondary | ICD-10-CM | POA: Diagnosis not present

## 2024-02-25 DIAGNOSIS — R5383 Other fatigue: Secondary | ICD-10-CM | POA: Diagnosis not present

## 2024-02-25 DIAGNOSIS — J479 Bronchiectasis, uncomplicated: Secondary | ICD-10-CM

## 2024-02-25 DIAGNOSIS — K5900 Constipation, unspecified: Secondary | ICD-10-CM | POA: Diagnosis not present

## 2024-02-25 DIAGNOSIS — R400 Somnolence: Secondary | ICD-10-CM

## 2024-02-25 DIAGNOSIS — H6993 Unspecified Eustachian tube disorder, bilateral: Secondary | ICD-10-CM

## 2024-02-25 DIAGNOSIS — G8929 Other chronic pain: Secondary | ICD-10-CM

## 2024-02-25 DIAGNOSIS — D509 Iron deficiency anemia, unspecified: Secondary | ICD-10-CM | POA: Diagnosis not present

## 2024-02-25 DIAGNOSIS — J449 Chronic obstructive pulmonary disease, unspecified: Secondary | ICD-10-CM

## 2024-02-25 DIAGNOSIS — E663 Overweight: Secondary | ICD-10-CM | POA: Diagnosis not present

## 2024-02-25 DIAGNOSIS — M419 Scoliosis, unspecified: Secondary | ICD-10-CM | POA: Diagnosis not present

## 2024-02-25 DIAGNOSIS — M5441 Lumbago with sciatica, right side: Secondary | ICD-10-CM

## 2024-02-25 DIAGNOSIS — N920 Excessive and frequent menstruation with regular cycle: Secondary | ICD-10-CM

## 2024-02-25 DIAGNOSIS — R200 Anesthesia of skin: Secondary | ICD-10-CM | POA: Diagnosis not present

## 2024-02-25 DIAGNOSIS — M549 Dorsalgia, unspecified: Secondary | ICD-10-CM | POA: Diagnosis not present

## 2024-02-25 MED ORDER — FLUTICASONE PROPIONATE 50 MCG/ACT NA SUSP: 2.0000 | Freq: Every day | NASAL | 6 refills | Status: AC

## 2024-02-25 NOTE — Progress Notes (Signed)
 Subjective:    Patient ID: Mandy Garcia, female    DOB: June 23, 1986, 38 y.o.   MRN: [id number]  Chief Complaint  Patient presents with   Medical Management of Chronic Issues    When on cycle top part of legs go numb    Pt presents to the office today for chronic follow up.   She continues to have menorrhagia with 2 days of heavy bleeding. Reports using 20 pads in those 2 days. She has seen GYN and recommend hysterectomy, however, she can not miss a month of work.   Has COPD and continues to smoke 1/2 pack a day.   Reports constant fatigue and needs naps during the day.   Complaining of bilateral thigh numbness and pain that is worse in left. Has chronic back pain of 5 out 10.  Constipation This is a chronic problem. The current episode started more than 1 year ago. Her stool frequency is 1 time per week or less. She has tried laxatives for the symptoms. The treatment provided mild relief.  Nicotine Dependence Presents for follow-up visit. The symptoms have been stable. She smokes < 1/2 a pack of cigarettes per day.  Gastroesophageal Reflux She complains of belching and heartburn. She reports no dysphagia. This is a chronic problem. The current episode started more than 1 year ago. The problem occurs occasionally. She has tried a histamine-2 antagonist for the symptoms. The treatment provided moderate relief.      Review of Systems  Gastrointestinal:  Positive for constipation and heartburn. Negative for dysphagia.  All other systems reviewed and are negative.  Family History  Problem Relation Age of Onset   Hypertension Paternal Grandfather    Cancer Maternal Grandmother        lung   Diabetes Maternal Grandfather    Hypertension Father    Ovarian cancer Mother    Cancer Mother        cervix   Hypertension Mother    Diabetes Sister    Hypertension Sister    Bipolar disorder Daughter    Tourette syndrome Son    ADD / ADHD Son    Social History   Socioeconomic  History   Marital status: Married    Spouse name: Not on file   Number of children: Not on file   Years of education: Not on file   Highest education level: Not on file  Occupational History   Not on file  Tobacco Use   Smoking status: Every Day    Current packs/day: 0.50    Average packs/day: 0.5 packs/day for 16.0 years (8.0 ttl pk-yrs)    Types: Cigarettes    Passive exposure: Current   Smokeless tobacco: Never  Vaping Use   Vaping status: Never Used  Substance and Sexual Activity   Alcohol use: No   Drug use: No   Sexual activity: Not Currently    Birth control/protection: Surgical    Comment: tubal and ablation  Other Topics Concern   Not on file  Social History Narrative   Not on file   Social Drivers of Health   Financial Resource Strain: Low Risk  (03/13/2022)   Overall Financial Resource Strain (CARDIA)    Difficulty of Paying Living Expenses: Not hard at all  Food Insecurity: No Food Insecurity (03/13/2022)   Hunger Vital Sign    Worried About Running Out of Food in the Last Year: Never true    Ran Out of Food in the Last Year: Never true  Transportation  Needs: No Transportation Needs (03/13/2022)   PRAPARE - Administrator, Civil Service (Medical): No    Lack of Transportation (Non-Medical): No  Physical Activity: Sufficiently Active (03/13/2022)   Exercise Vital Sign    Days of Exercise per Week: 7 days    Minutes of Exercise per Session: 100 min  Stress: No Stress Concern Present (03/13/2022)   [employer] of Occupational Health - Occupational Stress Questionnaire    Feeling of Stress : Only a little  Social Connections: Socially Isolated (03/13/2022)   Social Connection and Isolation Panel [NHANES]    Frequency of Communication with Friends and Family: More than three times a week    Frequency of Social Gatherings with Friends and Family: More than three times a week    Attends Religious Services: Never    Database administrator or  Organizations: No    Attends Banker Meetings: Never    Marital Status: Divorced       Objective:   Physical Exam Vitals reviewed.  Constitutional:      General: She is not in acute distress.    Appearance: She is well-developed.  HENT:     Head: Normocephalic and atraumatic.     Right Ear: Tympanic membrane normal.     Left Ear: Tympanic membrane normal.  Eyes:     Pupils: Pupils are equal, round, and reactive to light.  Neck:     Thyroid: No thyromegaly.  Cardiovascular:     Rate and Rhythm: Normal rate and regular rhythm.     Heart sounds: Normal heart sounds. No murmur heard. Pulmonary:     Effort: Pulmonary effort is normal. No respiratory distress.     Breath sounds: Normal breath sounds. No wheezing.  Chest:  Breasts:    Right: No swelling, bleeding, inverted nipple, mass, nipple discharge, skin change or tenderness.     Left: No swelling, bleeding, inverted nipple, mass, nipple discharge, skin change or tenderness.  Abdominal:     General: Bowel sounds are normal. There is no distension.     Palpations: Abdomen is soft.     Tenderness: There is no abdominal tenderness.  Musculoskeletal:        General: No tenderness. Normal range of motion.     Cervical back: Normal range of motion and neck supple.     Comments: Full ROM of lumbar, mild pain with flexion  Skin:    General: Skin is warm and dry.  Neurological:     Mental Status: She is alert and oriented to person, place, and time.     Cranial Nerves: No cranial nerve deficit.     Deep Tendon Reflexes: Reflexes are normal and symmetric.  Psychiatric:        Behavior: Behavior normal.        Thought Content: Thought content normal.        Judgment: Judgment normal.      BP 116/82   Pulse 90   Temp 98.2 F (36.8 C) (Temporal)   Ht 5' 3.5" (1.613 m)   Wt 167 lb (75.8 kg)   SpO2 93%   BMI 29.12 kg/m      Assessment & Plan:  Mandy Garcia comes in today with chief complaint of  Medical Management of Chronic Issues (When on cycle top part of legs go numb )   Diagnosis and orders addressed: 1. Dysfunction of both eustachian tubes - fluticasone (FLONASE) 50 MCG/ACT nasal spray; Place 2 sprays into both nostrils daily.  Dispense: 16 g; Refill: 6 - CMP14+EGFR - CBC with Differential/Platelet  2. Constipation, unspecified constipation type (Primary) - CMP14+EGFR - CBC with Differential/Platelet  3. Overweight (BMI 25.0-29.9)  - CMP14+EGFR - CBC with Differential/Platelet  4. 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 Iron deficiency anemia, unspecified iron deficiency anemia type - CMP14+EGFR - CBC with Differential/Platelet - Iron, TIBC and Ferritin Panel  6. Cigarette smoker  - CMP14+EGFR - CBC with Differential/Platelet  7. Menorrhagia with regular cycle - CMP14+EGFR - CBC with Differential/Platelet  8. Other fatigue - CMP14+EGFR - CBC with Differential/Platelet - Iron, TIBC and Ferritin Panel - TSH - VITAMIN D 25 Hydroxy (Vit-D Deficiency, Fractures) - Ambulatory referral to Sleep Studies  9. Daytime sleepiness - Ambulatory referral to Sleep Studies  10. Chronic midline low back pain with bilateral sciatica  - DG Lumbar Spine 2-3 Views; Future  Labs pending Continue current medications  Health Maintenance reviewed Diet and exercise encouraged  Follow up plan: 6 months    [name], FNP

## 2024-02-25 NOTE — Patient Instructions (Signed)
 Fatigue If you have fatigue, you feel tired all the time and have a lack of energy or a lack of motivation. Fatigue may make it difficult to start or complete tasks because of exhaustion. Occasional or mild fatigue is often a normal response to activity or life. However, long-term (chronic) or extreme fatigue may be a symptom of a medical condition such as: Depression. Not having enough red blood cells or hemoglobin in the blood (anemia). A problem with a small gland located in the lower front part of the neck (thyroid disorder). Rheumatologic conditions. These are problems related to the body's defense system (immune system). Infections, especially certain viral infections. Fatigue can also lead to negative health outcomes over time. Follow these instructions at home: Medicines Take over-the-counter and prescription medicines only as told by your health care provider. Take a multivitamin if told by your health care provider. Do not use herbal or dietary supplements unless they are approved by your health care provider. Eating and drinking  Avoid heavy meals in the evening. Eat a well-balanced diet, which includes lean proteins, whole grains, plenty of fruits and vegetables, and low-fat dairy products. Avoid eating or drinking too many products with caffeine in them. Avoid alcohol. Drink enough fluid to keep your urine pale yellow. Activity  Exercise regularly, as told by your health care provider. Use or practice techniques to help you relax, such as yoga, tai chi, meditation, or massage therapy. Lifestyle Change situations that cause you stress. Try to keep your work and personal schedules in balance. Do not use recreational or illegal drugs. General instructions Monitor your fatigue for any changes. Go to bed and get up at the same time every day. Avoid fatigue by pacing yourself during the day and getting enough sleep at night. Maintain a healthy weight. Contact a health care  provider if: Your fatigue does not get better. You have a fever. You suddenly lose or gain weight. You have headaches. You have trouble falling asleep or sleeping through the night. You feel angry, guilty, anxious, or sad. You have swelling in your legs or another part of your body. Get help right away if: You feel confused, feel like you might faint, or faint. Your vision is blurry or you have a severe headache. You have severe pain in your abdomen, your back, or the area between your waist and hips (pelvis). You have chest pain, shortness of breath, or an irregular or fast heartbeat. You are unable to urinate, or you urinate less than normal. You have abnormal bleeding from the rectum, nose, lungs, nipples, or, if you are female, the vagina. You vomit blood. You have thoughts about hurting yourself or others. These symptoms may be an emergency. Get help right away. Call 911. Do not wait to see if the symptoms will go away. Do not drive yourself to the hospital. Get help right away if you feel like you may hurt yourself or others, or have thoughts about taking your own life. Go to your nearest emergency room or: Call 911. Call the National Suicide Prevention Lifeline at (262)721-8699 or 988. This is open 24 hours a day. Text the Crisis Text Line at 8450584327. Summary If you have fatigue, you feel tired all the time and have a lack of energy or a lack of motivation. Fatigue may make it difficult to start or complete tasks because of exhaustion. Long-term (chronic) or extreme fatigue may be a symptom of a medical condition. Exercise regularly, as told by your health care provider.  Change situations that cause you stress. Try to keep your work and personal schedules in balance. This information is not intended to replace advice given to you by your health care provider. Make sure you discuss any questions you have with your health care provider. Document Revised: 09/22/2021 Document  Reviewed: 09/22/2021 Elsevier Patient Education  2024 [employer].

## 2024-02-26 LAB — CBC WITH DIFFERENTIAL/PLATELET
Basophils Absolute: 0 10*3/uL (ref 0.0–0.2)
Basos: 1 %
EOS (ABSOLUTE): 0.1 10*3/uL (ref 0.0–0.4)
Eos: 1 %
Hematocrit: 47.6 % — ABNORMAL HIGH (ref 34.0–46.6)
Hemoglobin: 15.7 g/dL (ref 11.1–15.9)
Immature Grans (Abs): 0 10*3/uL (ref 0.0–0.1)
Immature Granulocytes: 0 %
Lymphocytes Absolute: 1.4 10*3/uL (ref 0.7–3.1)
Lymphs: 21 %
MCH: 29.6 pg (ref 26.6–33.0)
MCHC: 33 g/dL (ref 31.5–35.7)
MCV: 90 fL (ref 79–97)
Monocytes Absolute: 0.8 10*3/uL (ref 0.1–0.9)
Monocytes: 12 %
Neutrophils Absolute: 4.4 10*3/uL (ref 1.4–7.0)
Neutrophils: 65 %
Platelets: 229 10*3/uL (ref 150–450)
RBC: 5.3 x10E6/uL — ABNORMAL HIGH (ref 3.77–5.28)
RDW: 12.9 % (ref 11.7–15.4)
WBC: 6.7 10*3/uL (ref 3.4–10.8)

## 2024-02-26 LAB — CMP14+EGFR
ALT: 30 IU/L (ref 0–32)
AST: 23 IU/L (ref 0–40)
Albumin: 4.4 g/dL (ref 3.9–4.9)
Alkaline Phosphatase: 98 IU/L (ref 44–121)
BUN/Creatinine Ratio: 12 (ref 9–23)
BUN: 9 mg/dL (ref 6–24)
Bilirubin Total: 0.2 mg/dL (ref 0.0–1.2)
CO2: 21 mmol/L (ref 20–29)
Calcium: 9.3 mg/dL (ref 8.7–10.2)
Chloride: 103 mmol/L (ref 96–106)
Creatinine, Ser: 0.78 mg/dL (ref 0.57–1.00)
Globulin, Total: 2.1 g/dL (ref 1.5–4.5)
Glucose: 81 mg/dL (ref 70–99)
Potassium: 4.4 mmol/L (ref 3.5–5.2)
Sodium: 139 mmol/L (ref 134–144)
Total Protein: 6.5 g/dL (ref 6.0–8.5)
eGFR: 98 mL/min/{1.73_m2} (ref >59)

## 2024-02-26 LAB — IRON,TIBC AND FERRITIN PANEL
Ferritin: 44 ng/mL (ref 15–150)
Iron Saturation: 13 % — ABNORMAL LOW (ref 15–55)
Iron: 38 ug/dL (ref 27–159)
Total Iron Binding Capacity: 286 ug/dL (ref 250–450)
UIBC: 248 ug/dL (ref 131–425)

## 2024-02-26 LAB — VITAMIN D 25 HYDROXY (VIT D DEFICIENCY, FRACTURES): Vit D, 25-Hydroxy: 16.8 ng/mL — ABNORMAL LOW (ref 30.0–100.0)

## 2024-02-26 LAB — TSH: TSH: 2.19 u[IU]/mL (ref 0.450–4.500)

## 2024-02-28 ENCOUNTER — Other Ambulatory Visit: Payer: Self-pay | Admitting: Family

## 2024-02-28 MED ORDER — VITAMIN D (ERGOCALCIFEROL) 1.25 MG (50000 UNIT) PO CAPS: 50000.0000 [IU] | ORAL_CAPSULE | ORAL | 3 refills | Status: AC

## 2024-04-24 ENCOUNTER — Encounter (HOSPITAL_COMMUNITY): Payer: Self-pay | Admitting: Hematology and Oncology

## 2024-07-13 ENCOUNTER — Other Ambulatory Visit: Payer: Self-pay | Admitting: Family

## 2024-07-13 ENCOUNTER — Other Ambulatory Visit: Payer: Self-pay | Admitting: Internal Medicine

## 2024-07-13 DIAGNOSIS — H6993 Unspecified Eustachian tube disorder, bilateral: Secondary | ICD-10-CM

## 2024-09-01 ENCOUNTER — Ambulatory Visit: Admitting: Family

## 2024-09-01 ENCOUNTER — Encounter: Payer: Self-pay | Admitting: Family

## 2024-09-01 VITALS — BP 121/83 | HR 63 | Temp 97.6°F | Ht 63.5 in | Wt 182.6 lb

## 2024-09-01 DIAGNOSIS — N920 Excessive and frequent menstruation with regular cycle: Secondary | ICD-10-CM

## 2024-09-01 DIAGNOSIS — Z0000 Encounter for general adult medical examination without abnormal findings: Secondary | ICD-10-CM

## 2024-09-01 DIAGNOSIS — K5900 Constipation, unspecified: Secondary | ICD-10-CM

## 2024-09-01 DIAGNOSIS — Z1231 Encounter for screening mammogram for malignant neoplasm of breast: Secondary | ICD-10-CM | POA: Diagnosis not present

## 2024-09-01 DIAGNOSIS — K219 Gastro-esophageal reflux disease without esophagitis: Secondary | ICD-10-CM

## 2024-09-01 DIAGNOSIS — Z0001 Encounter for general adult medical examination with abnormal findings: Secondary | ICD-10-CM

## 2024-09-01 DIAGNOSIS — Z800 Family history of malignant neoplasm of digestive organs: Secondary | ICD-10-CM | POA: Diagnosis not present

## 2024-09-01 DIAGNOSIS — E669 Obesity, unspecified: Secondary | ICD-10-CM

## 2024-09-01 DIAGNOSIS — J479 Bronchiectasis, uncomplicated: Secondary | ICD-10-CM

## 2024-09-01 DIAGNOSIS — D509 Iron deficiency anemia, unspecified: Secondary | ICD-10-CM

## 2024-09-01 DIAGNOSIS — F17210 Nicotine dependence, cigarettes, uncomplicated: Secondary | ICD-10-CM

## 2024-09-01 DIAGNOSIS — R5383 Other fatigue: Secondary | ICD-10-CM | POA: Diagnosis not present

## 2024-09-01 LAB — LIPID PANEL

## 2024-09-01 MED ORDER — POLYETHYLENE GLYCOL 3350 17 GM/SCOOP PO POWD: 17.0000 g | Freq: Every day | ORAL | 1 refills | Status: AC

## 2024-09-01 NOTE — Patient Instructions (Signed)

## 2024-09-01 NOTE — Progress Notes (Addendum)
 Subjective:    Patient ID: Mandy Garcia, female    DOB: May 06, 1986, 38 y.o.   MRN: [id number]  Chief Complaint  Patient presents with   Medical Management of Chronic Issues   Pt presents to the office today for CPE without pap.   She continues to have menorrhagia with 2 days of heavy bleeding. Reports using 20 pads in those 2 days. She has seen GYN and recommend hysterectomy, however, she can not miss a month of work.   Reports her father was recently diagnosed with colon cancer and her grandfather died of colon cancer. Very worried about this and requesting referral to GI.  Constipation This is a chronic problem. The current episode started more than 1 year ago. Her stool frequency is 2 to 3 times per week. She has tried laxatives for the symptoms. The treatment provided mild relief.  Nicotine Dependence Presents for follow-up visit. Her urge triggers include company of smokers. The symptoms have been stable. Number of cigarettes per day: vapes daily.  Gastroesophageal Reflux She complains of belching and heartburn. The problem occurs occasionally. The symptoms are aggravated by certain foods. She has tried a histamine-2 antagonist for the symptoms. The treatment provided mild relief.      Review of Systems  Gastrointestinal:  Positive for constipation and heartburn.  All other systems reviewed and are negative.  Family History  Problem Relation Age of Onset   Hypertension Paternal Grandfather    Cancer Maternal Grandmother        lung   Diabetes Maternal Grandfather    Hypertension Father    Ovarian cancer Mother    Cancer Mother        cervix   Hypertension Mother    Diabetes Sister    Hypertension Sister    Bipolar disorder Daughter    Tourette syndrome Son    ADD / ADHD Son    Social History   Socioeconomic History   Marital status: Married    Spouse name: Not on file   Number of children: Not on file   Years of education: Not on file   Highest  education level: Not on file  Occupational History   Not on file  Tobacco Use   Smoking status: Every Day    Current packs/day: 0.50    Average packs/day: 0.5 packs/day for 16.0 years (8.0 ttl pk-yrs)    Types: Cigarettes    Passive exposure: Current   Smokeless tobacco: Never  Vaping Use   Vaping status: Never Used  Substance and Sexual Activity   Alcohol use: No   Drug use: No   Sexual activity: Not Currently    Birth control/protection: Surgical    Comment: tubal and ablation  Other Topics Concern   Not on file  Social History Narrative   Not on file   Social Drivers of Health   Financial Resource Strain: Low Risk  (03/13/2022)   Overall Financial Resource Strain (CARDIA)    Difficulty of Paying Living Expenses: Not hard at all  Food Insecurity: No Food Insecurity (03/13/2022)   Hunger Vital Sign    Worried About Running Out of Food in the Last Year: Never true    Ran Out of Food in the Last Year: Never true  Transportation Needs: No Transportation Needs (03/13/2022)   PRAPARE - Administrator, Civil Service (Medical): No    Lack of Transportation (Non-Medical): No  Physical Activity: Sufficiently Active (03/13/2022)   Exercise Vital Sign    Days  of Exercise per Week: 7 days    Minutes of Exercise per Session: 100 min  Stress: No Stress Concern Present (03/13/2022)   [employer] of Occupational Health - Occupational Stress Questionnaire    Feeling of Stress : Only a little  Social Connections: Socially Isolated (03/13/2022)   Social Connection and Isolation Panel    Frequency of Communication with Friends and Family: More than three times a week    Frequency of Social Gatherings with Friends and Family: More than three times a week    Attends Religious Services: Never    Database administrator or Organizations: No    Attends Banker Meetings: Never    Marital Status: Divorced       Objective:   Physical Exam Vitals reviewed.   Constitutional:      General: She is not in acute distress.    Appearance: She is well-developed.  HENT:     Head: Normocephalic and atraumatic.     Right Ear: Tympanic membrane normal.     Left Ear: Tympanic membrane normal.  Eyes:     Pupils: Pupils are equal, round, and reactive to light.  Neck:     Thyroid: No thyromegaly.  Cardiovascular:     Rate and Rhythm: Normal rate and regular rhythm.     Heart sounds: Normal heart sounds. No murmur heard. Pulmonary:     Effort: Pulmonary effort is normal. No respiratory distress.     Breath sounds: Normal breath sounds. No wheezing.  Chest:  Breasts:    Right: No swelling, bleeding, inverted nipple, mass, nipple discharge, skin change or tenderness.     Left: No swelling, bleeding, inverted nipple, mass, nipple discharge, skin change or tenderness.  Abdominal:     General: Bowel sounds are normal. There is no distension.     Palpations: Abdomen is soft.     Tenderness: There is no abdominal tenderness.  Musculoskeletal:        General: No tenderness. Normal range of motion.     Cervical back: Normal range of motion and neck supple.  Skin:    General: Skin is warm and dry.  Neurological:     Mental Status: She is alert and oriented to person, place, and time.     Cranial Nerves: No cranial nerve deficit.     Deep Tendon Reflexes: Reflexes are normal and symmetric.  Psychiatric:        Behavior: Behavior normal.        Thought Content: Thought content normal.        Judgment: Judgment normal.      BP 121/83   Pulse 63   Temp 97.6 F (36.4 C) (Temporal)   Ht 5' 3.5 (1.613 m)   Wt 182 lb 9.6 oz (82.8 kg)   SpO2 97%   BMI 31.84 kg/m      Assessment & Plan:  Mandy Garcia comes in today with chief complaint of Medical Management of Chronic Issues   Diagnosis and orders addressed:  1. Annual physical exam (Primary) - CMP14+EGFR - Anemia Profile B - Lipid panel - TSH  2. Iron deficiency anemia,  unspecified iron deficiency anemia type - CMP14+EGFR - Anemia Profile B  3. Cigarette smoker - CMP14+EGFR  4. Constipation, unspecified constipation type  - Ambulatory referral to Gastroenterology - polyethylene glycol powder (GLYCOLAX /MIRALAX ) 17 GM/SCOOP powder; Take 17 g by mouth daily. Dissolve 1 capful (17g) in 4-8 ounces of liquid and take by mouth daily.  Dispense: 3350 g;  Refill: 1 - CMP14+EGFR  5. Menorrhagia with regular cycle - CMP14+EGFR  6. 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 Obesity (BMI 30-39.9) - CMP14+EGFR  8. Other fatigue - CMP14+EGFR - Anemia Profile B - TSH  9. Encounter for screening mammogram for malignant neoplasm of breast - MM 3D SCREENING MAMMOGRAM BILATERAL BREAST; Future - CMP14+EGFR   10. Gastroesophageal reflux disease without esophagitis Continue current medications     Labs pending Continue current medications  Referral to GI pending per her request.  Health Maintenance reviewed Diet and exercise encouraged  Follow up plan: 6 months    Bari Learn, FNP

## 2024-09-02 LAB — ANEMIA PROFILE B
Basophils Absolute: 0 x10E3/uL (ref 0.0–0.2)
Basos: 1 %
EOS (ABSOLUTE): 0.1 x10E3/uL (ref 0.0–0.4)
Eos: 2 %
Ferritin: 44 ng/mL (ref 15–150)
Folate: 18.2 ng/mL (ref >3.0)
Hematocrit: 43.9 % (ref 34.0–46.6)
Hemoglobin: 13.9 g/dL (ref 11.1–15.9)
Immature Grans (Abs): 0 x10E3/uL (ref 0.0–0.1)
Immature Granulocytes: 0 %
Iron Saturation: 34 % (ref 15–55)
Iron: 89 ug/dL (ref 27–159)
Lymphocytes Absolute: 1.6 x10E3/uL (ref 0.7–3.1)
Lymphs: 25 %
MCH: 29.7 pg (ref 26.6–33.0)
MCHC: 31.7 g/dL (ref 31.5–35.7)
MCV: 94 fL (ref 79–97)
Monocytes Absolute: 0.6 x10E3/uL (ref 0.1–0.9)
Monocytes: 9 %
Neutrophils Absolute: 4.2 x10E3/uL (ref 1.4–7.0)
Neutrophils: 63 %
Platelets: 226 x10E3/uL (ref 150–450)
RBC: 4.68 x10E6/uL (ref 3.77–5.28)
RDW: 13 % (ref 11.7–15.4)
Retic Ct Pct: 1.4 % (ref 0.6–2.6)
Total Iron Binding Capacity: 263 ug/dL (ref 250–450)
UIBC: 174 ug/dL (ref 131–425)
Vitamin B-12: 400 pg/mL (ref 232–1245)
WBC: 6.6 x10E3/uL (ref 3.4–10.8)

## 2024-09-02 LAB — LIPID PANEL
Chol/HDL Ratio: 4.2 ratio (ref 0.0–4.4)
Cholesterol, Total: 177 mg/dL (ref 100–199)
HDL: 42 mg/dL (ref >39)
LDL Chol Calc (NIH): 121 mg/dL — ABNORMAL HIGH (ref 0–99)
Triglycerides: 72 mg/dL (ref 0–149)
VLDL Cholesterol Cal: 14 mg/dL (ref 5–40)

## 2024-09-02 LAB — TSH: TSH: 2.49 u[IU]/mL (ref 0.450–4.500)

## 2024-09-02 LAB — CMP14+EGFR
ALT: 29 IU/L (ref 0–32)
AST: 26 IU/L (ref 0–40)
Albumin: 4 g/dL (ref 3.9–4.9)
Alkaline Phosphatase: 69 IU/L (ref 41–116)
BUN/Creatinine Ratio: 15 (ref 9–23)
BUN: 11 mg/dL (ref 6–24)
Bilirubin Total: 0.4 mg/dL (ref 0.0–1.2)
CO2: 22 mmol/L (ref 20–29)
Calcium: 8.9 mg/dL (ref 8.7–10.2)
Chloride: 107 mmol/L — ABNORMAL HIGH (ref 96–106)
Creatinine, Ser: 0.72 mg/dL (ref 0.57–1.00)
Globulin, Total: 1.8 g/dL (ref 1.5–4.5)
Glucose: 84 mg/dL (ref 70–99)
Potassium: 4 mmol/L (ref 3.5–5.2)
Sodium: 142 mmol/L (ref 134–144)
Total Protein: 5.8 g/dL — ABNORMAL LOW (ref 6.0–8.5)
eGFR: 108 mL/min/1.73 (ref >59)

## 2024-09-04 ENCOUNTER — Ambulatory Visit: Payer: Self-pay | Admitting: Family

## 2024-09-04 DIAGNOSIS — M25552 Pain in left hip: Secondary | ICD-10-CM | POA: Diagnosis not present

## 2024-09-04 DIAGNOSIS — R0789 Other chest pain: Secondary | ICD-10-CM | POA: Diagnosis not present

## 2024-09-04 DIAGNOSIS — M25561 Pain in right knee: Secondary | ICD-10-CM | POA: Diagnosis not present

## 2024-09-04 DIAGNOSIS — J9811 Atelectasis: Secondary | ICD-10-CM | POA: Diagnosis not present

## 2024-09-04 DIAGNOSIS — S8391XA Sprain of unspecified site of right knee, initial encounter: Secondary | ICD-10-CM | POA: Diagnosis not present

## 2024-09-04 DIAGNOSIS — R609 Edema, unspecified: Secondary | ICD-10-CM | POA: Diagnosis not present

## 2024-09-04 DIAGNOSIS — S8981XA Other specified injuries of right lower leg, initial encounter: Secondary | ICD-10-CM | POA: Diagnosis not present

## 2024-09-04 DIAGNOSIS — M25551 Pain in right hip: Secondary | ICD-10-CM | POA: Diagnosis not present

## 2024-09-04 DIAGNOSIS — R1084 Generalized abdominal pain: Secondary | ICD-10-CM | POA: Diagnosis not present

## 2024-09-08 ENCOUNTER — Ambulatory Visit

## 2024-09-08 ENCOUNTER — Encounter: Payer: Self-pay | Admitting: Family Medicine

## 2024-09-08 DIAGNOSIS — D72829 Elevated white blood cell count, unspecified: Secondary | ICD-10-CM | POA: Diagnosis not present

## 2024-09-08 DIAGNOSIS — M25552 Pain in left hip: Secondary | ICD-10-CM

## 2024-09-08 DIAGNOSIS — M25561 Pain in right knee: Secondary | ICD-10-CM

## 2024-09-08 DIAGNOSIS — R0789 Other chest pain: Secondary | ICD-10-CM | POA: Diagnosis not present

## 2024-09-08 DIAGNOSIS — S301XXD Contusion of abdominal wall, subsequent encounter: Secondary | ICD-10-CM

## 2024-09-08 DIAGNOSIS — T07XXXA Unspecified multiple injuries, initial encounter: Secondary | ICD-10-CM | POA: Diagnosis not present

## 2024-09-08 LAB — CBC WITH DIFFERENTIAL/PLATELET
Basophils Absolute: 0.1 x10E3/uL (ref 0.0–0.2)
Basos: 1 %
EOS (ABSOLUTE): 0.1 x10E3/uL (ref 0.0–0.4)
Eos: 1 %
Hematocrit: 42.7 % (ref 34.0–46.6)
Hemoglobin: 14 g/dL (ref 11.1–15.9)
Immature Grans (Abs): 0 x10E3/uL (ref 0.0–0.1)
Immature Granulocytes: 0 %
Lymphocytes Absolute: 2.1 x10E3/uL (ref 0.7–3.1)
Lymphs: 26 %
MCH: 29.9 pg (ref 26.6–33.0)
MCHC: 32.8 g/dL (ref 31.5–35.7)
MCV: 91 fL (ref 79–97)
Monocytes Absolute: 0.7 x10E3/uL (ref 0.1–0.9)
Monocytes: 9 %
Neutrophils Absolute: 5.2 x10E3/uL (ref 1.4–7.0)
Neutrophils: 63 %
Platelets: 270 x10E3/uL (ref 150–450)
RBC: 4.68 x10E6/uL (ref 3.77–5.28)
RDW: 12.7 % (ref 11.7–15.4)
WBC: 8.2 x10E3/uL (ref 3.4–10.8)

## 2024-09-08 MED ORDER — MELOXICAM 15 MG PO TABS: 15.0000 mg | ORAL_TABLET | Freq: Every day | ORAL | 0 refills | Status: DC

## 2024-09-08 MED ORDER — OXYCODONE-ACETAMINOPHEN 5-325 MG PO TABS: 1.0000 | ORAL_TABLET | Freq: Four times a day (QID) | ORAL | 0 refills | Status: AC | PRN
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
## 2024-09-11 ENCOUNTER — Ambulatory Visit: Payer: Self-pay | Admitting: Family Medicine

## 2024-09-11 ENCOUNTER — Encounter: Payer: Self-pay | Admitting: Gastroenterology

## 2024-09-21 ENCOUNTER — Ambulatory Visit: Admitting: Family

## 2024-09-21 ENCOUNTER — Encounter (HOSPITAL_COMMUNITY): Payer: Self-pay | Admitting: Hematology and Oncology

## 2024-09-21 ENCOUNTER — Encounter: Payer: Self-pay | Admitting: Family

## 2024-09-21 DIAGNOSIS — M25561 Pain in right knee: Secondary | ICD-10-CM

## 2024-09-21 DIAGNOSIS — M25551 Pain in right hip: Secondary | ICD-10-CM

## 2024-09-21 DIAGNOSIS — M545 Low back pain, unspecified: Secondary | ICD-10-CM | POA: Diagnosis not present

## 2024-09-21 DIAGNOSIS — H6993 Unspecified Eustachian tube disorder, bilateral: Secondary | ICD-10-CM | POA: Diagnosis not present

## 2024-09-21 MED ORDER — FAMOTIDINE 20 MG PO TABS: 20.0000 mg | ORAL_TABLET | Freq: Two times a day (BID) | ORAL | 2 refills | Status: AC

## 2024-09-21 MED ORDER — CETIRIZINE HCL 10 MG PO TABS: 10.0000 mg | ORAL_TABLET | Freq: Every day | ORAL | 1 refills | Status: AC

## 2024-09-21 MED ORDER — HYDROCODONE-ACETAMINOPHEN 5-325 MG PO TABS: 1.0000 | ORAL_TABLET | Freq: Four times a day (QID) | ORAL | 0 refills | Status: DC | PRN
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
## 2024-09-21 NOTE — Patient Instructions (Signed)
Contusion A contusion is a deep bruise. Contusions are the result of a blunt injury to tissues and muscle fibers under the skin. The injury causes bleeding under the skin. The skin over the contusion may turn blue, purple, or yellow. Minor injuries will give you a painless contusion, but more severe injuries cause contusions that can stay painful and swollen for a few weeks. Follow these instructions at home: Pay attention to any changes in your symptoms. Let your health care provider know about them. Take these actions to relieve your pain. Managing pain, stiffness, and swelling  Use resting, icing, applying pressure (compression), and raising (elevating) the injured area. This is often called the RICE method. Rest the injured area. Return to your normal activities as told by your health care provider. Ask your health care provider what activities are safe for you. If directed, put ice on the injured area. To do this: Put ice in a plastic bag. Place a towel between your skin and the bag. Leave the ice on for 20 minutes, 2-3 times a day. If your skin turns bright red, remove the ice right away to prevent skin damage. The risk of skin damage is higher if you cannot feel pain, heat, or cold. If directed, apply light compression to the injured area using an elastic bandage. Make sure the bandage is not wrapped too tightly. Remove and reapply the bandage as directed by your health care provider. If possible, elevate the injured area above the level of your heart while you are sitting or lying down. General instructions Take over-the-counter and prescription medicines only as told by your health care provider. Keep all follow-up visits. Your health care provider may want to see how your contusion is healing with treatment. Contact a health care provider if: Your symptoms do not improve after several days of treatment. Your symptoms get worse. You have difficulty moving the injured area. Get help  right away if: You have severe pain. You have numbness in a hand or foot. Your hand or foot turns pale or cold. This information is not intended to replace advice given to you by your health care provider. Make sure you discuss any questions you have with your health care provider. Document Revised: 05/18/2022 Document Reviewed: 05/18/2022 Elsevier Patient Education  2024 [employer].

## 2024-10-20 ENCOUNTER — Encounter: Payer: Self-pay | Admitting: Gastroenterology

## 2024-10-20 ENCOUNTER — Ambulatory Visit: Admitting: Gastroenterology

## 2024-10-20 VITALS — BP 136/83 | HR 87 | Temp 98.2°F | Ht 62.0 in | Wt 187.4 lb

## 2024-10-20 DIAGNOSIS — R1310 Dysphagia, unspecified: Secondary | ICD-10-CM | POA: Diagnosis not present

## 2024-10-20 DIAGNOSIS — K219 Gastro-esophageal reflux disease without esophagitis: Secondary | ICD-10-CM

## 2024-10-20 DIAGNOSIS — Z800 Family history of malignant neoplasm of digestive organs: Secondary | ICD-10-CM | POA: Diagnosis not present

## 2024-10-20 DIAGNOSIS — Z01818 Encounter for other preprocedural examination: Secondary | ICD-10-CM | POA: Diagnosis not present

## 2024-10-20 DIAGNOSIS — K5900 Constipation, unspecified: Secondary | ICD-10-CM | POA: Diagnosis not present

## 2024-10-20 MED ORDER — LUBIPROSTONE 24 MCG PO CAPS: 24.0000 ug | ORAL_CAPSULE | Freq: Two times a day (BID) | ORAL | 11 refills | Status: AC

## 2024-10-20 NOTE — Patient Instructions (Addendum)
 Start lubiprostone 24mcg twice daily with food for constipation. Expect a washout period (frequent and sometimes watery stools) the first week, typically gets better. If significant, you can decrease to once daily or every other day. Call if you need dosage adjusted further. Call if you are not moving your bowels at least 3-4 days per week.  You can stop miralax  powder once bowels moving well on lubiprostone. We will schedule upper endoscopy and colonoscopy in the near future. Please call if any change in symptoms, worsening abdominal pain.  You will need to go to [facility] lab 3-5 business days before your procedure to complete a urine pregnancy test.
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
## 2024-10-23 ENCOUNTER — Encounter (INDEPENDENT_AMBULATORY_CARE_PROVIDER_SITE_OTHER): Payer: Self-pay | Admitting: *Deleted

## 2024-10-23 ENCOUNTER — Other Ambulatory Visit (INDEPENDENT_AMBULATORY_CARE_PROVIDER_SITE_OTHER): Payer: Self-pay | Admitting: *Deleted

## 2024-10-23 MED ORDER — PEG 3350-KCL-NA BICARB-NACL 420 G PO SOLR: 4000.0000 mL | Freq: Once | ORAL | 0 refills | Status: AC

## 2024-11-14 ENCOUNTER — Encounter (HOSPITAL_COMMUNITY)
Admission: RE | Disposition: A | Discharge status: Home / Self Care | Payer: Self-pay | Source: Home / Self Care | Attending: Internal Medicine

## 2024-11-14 ENCOUNTER — Ambulatory Visit (HOSPITAL_COMMUNITY): Admitting: Certified Registered"

## 2024-11-14 ENCOUNTER — Other Ambulatory Visit: Payer: Self-pay

## 2024-11-14 ENCOUNTER — Encounter (HOSPITAL_COMMUNITY): Payer: Self-pay | Admitting: Internal Medicine

## 2024-11-14 ENCOUNTER — Ambulatory Visit (HOSPITAL_COMMUNITY)
Admission: RE | Admit: 2024-11-14 | Discharge: 2024-11-14 | Disposition: A | Discharge status: Home / Self Care | Attending: Internal Medicine | Admitting: Internal Medicine

## 2024-11-14 DIAGNOSIS — K449 Diaphragmatic hernia without obstruction or gangrene: Secondary | ICD-10-CM | POA: Diagnosis not present

## 2024-11-14 DIAGNOSIS — D120 Benign neoplasm of cecum: Secondary | ICD-10-CM

## 2024-11-14 DIAGNOSIS — F17200 Nicotine dependence, unspecified, uncomplicated: Secondary | ICD-10-CM | POA: Diagnosis not present

## 2024-11-14 DIAGNOSIS — K222 Esophageal obstruction: Secondary | ICD-10-CM

## 2024-11-14 DIAGNOSIS — K5909 Other constipation: Secondary | ICD-10-CM | POA: Diagnosis not present

## 2024-11-14 DIAGNOSIS — K2950 Unspecified chronic gastritis without bleeding: Secondary | ICD-10-CM | POA: Diagnosis not present

## 2024-11-14 DIAGNOSIS — Z800 Family history of malignant neoplasm of digestive organs: Secondary | ICD-10-CM | POA: Diagnosis not present

## 2024-11-14 DIAGNOSIS — F17210 Nicotine dependence, cigarettes, uncomplicated: Secondary | ICD-10-CM | POA: Diagnosis not present

## 2024-11-14 DIAGNOSIS — B9681 Helicobacter pylori [H. pylori] as the cause of diseases classified elsewhere: Secondary | ICD-10-CM

## 2024-11-14 DIAGNOSIS — Z139 Encounter for screening, unspecified: Secondary | ICD-10-CM | POA: Diagnosis not present

## 2024-11-14 DIAGNOSIS — K6389 Other specified diseases of intestine: Secondary | ICD-10-CM | POA: Diagnosis not present

## 2024-11-14 DIAGNOSIS — J449 Chronic obstructive pulmonary disease, unspecified: Secondary | ICD-10-CM | POA: Diagnosis not present

## 2024-11-14 DIAGNOSIS — Z1211 Encounter for screening for malignant neoplasm of colon: Secondary | ICD-10-CM | POA: Diagnosis not present

## 2024-11-14 DIAGNOSIS — A048 Other specified bacterial intestinal infections: Secondary | ICD-10-CM | POA: Diagnosis not present

## 2024-11-14 DIAGNOSIS — K635 Polyp of colon: Secondary | ICD-10-CM | POA: Diagnosis not present

## 2024-11-14 DIAGNOSIS — Z79899 Other long term (current) drug therapy: Secondary | ICD-10-CM | POA: Diagnosis not present

## 2024-11-14 HISTORY — PX: COLONOSCOPY: SHX5424

## 2024-11-14 HISTORY — PX: ESOPHAGOGASTRODUODENOSCOPY: SHX5428

## 2024-11-14 HISTORY — PX: ESOPHAGEAL DILATION: SHX303

## 2024-11-14 HISTORY — PX: POLYPECTOMY: SHX149

## 2024-11-14 SURGERY — COLONOSCOPY
Anesthesia: General

## 2024-11-14 MED ORDER — LACTATED RINGERS IV SOLN: INTRAVENOUS | Status: DC | PRN

## 2024-11-14 MED ORDER — PROPOFOL 10 MG/ML IV BOLUS
INTRAVENOUS | Status: DC | PRN
  Administered 2024-11-14: 100 mg via INTRAVENOUS
  Administered 2024-11-14: 175 ug/kg/min via INTRAVENOUS

## 2024-11-14 MED ORDER — LIDOCAINE HCL (CARDIAC) PF 100 MG/5ML IV SOSY
PREFILLED_SYRINGE | INTRAVENOUS | Status: DC | PRN
  Administered 2024-11-14: 100 mg via INTRAVENOUS

## 2024-11-14 MED ORDER — LACTATED RINGERS IV SOLN: INTRAVENOUS | Status: DC

## 2024-11-14 NOTE — Interval H&P Note (Signed)
 History and Physical Interval Note:  11/14/2024 9:58 AM  Mandy Garcia  has presented today for surgery, with the diagnosis of SCREENING (HIGH RISK),DYSPHAGIA.  The various methods of treatment have been discussed with the patient and family. After consideration of risks, benefits and other options for treatment, the patient has consented to  Procedure(s) with comments: COLONOSCOPY (N/A) - 10:45 AM, ASA 2 EGD (ESOPHAGOGASTRODUODENOSCOPY) (N/A) DILATION, ESOPHAGUS (N/A) as a surgical intervention.  The patient's history has been reviewed, patient examined, no change in status, stable for surgery.  I have reviewed the patient's chart and labs.  Questions were answered to the patient's satisfaction.     [name]

## 2024-11-14 NOTE — Discharge Instructions (Addendum)
 EGD Discharge instructions Please read the instructions outlined below and refer to this sheet in the next few weeks. These discharge instructions provide you with general information on caring for yourself after you leave the hospital. Your doctor may also give you specific instructions. While your treatment has been planned according to the most current medical practices available, unavoidable complications occasionally occur. If you have any problems or questions after discharge, please call your doctor. ACTIVITY You may resume your regular activity but move at a slower pace for the next 24 hours.  Take frequent rest periods for the next 24 hours.  Walking will help expel (get rid of) the air and reduce the bloated feeling in your abdomen.  No driving for 24 hours (because of the anesthesia (medicine) used during the test).  You may shower.  Do not sign any important legal documents or operate any machinery for 24 hours (because of the anesthesia used during the test).  NUTRITION Drink plenty of fluids.  You may resume your normal diet.  Begin with a light meal and progress to your normal diet.  Avoid alcoholic beverages for 24 hours or as instructed by your caregiver.  MEDICATIONS You may resume your normal medications unless your caregiver tells you otherwise.  WHAT YOU CAN EXPECT TODAY You may experience abdominal discomfort such as a feeling of fullness or "gas" pains.  FOLLOW-UP Your doctor will discuss the results of your test with you.  SEEK IMMEDIATE MEDICAL ATTENTION IF ANY OF THE FOLLOWING OCCUR: Excessive nausea (feeling sick to your stomach) and/or vomiting.  Severe abdominal pain and distention (swelling).  Trouble swallowing.  Temperature over 101 F (37.8 C).  Rectal bleeding or vomiting of blood.      Colonoscopy Discharge Instructions  Read the instructions outlined below and refer to this sheet in the next few weeks. These discharge instructions provide you  with general information on caring for yourself after you leave the hospital. Your doctor may also give you specific instructions. While your treatment has been planned according to the most current medical practices available, unavoidable complications occasionally occur.   ACTIVITY You may resume your regular activity, but move at a slower pace for the next 24 hours.  Take frequent rest periods for the next 24 hours.  Walking will help get rid of the air and reduce the bloated feeling in your belly (abdomen).  No driving for 24 hours (because of the medicine (anesthesia) used during the test).   Do not sign any important legal documents or operate any machinery for 24 hours (because of the anesthesia used during the test).  NUTRITION Drink plenty of fluids.  You may resume your normal diet as instructed by your doctor.  Begin with a light meal and progress to your normal diet. Heavy or fried foods are harder to digest and may make you feel sick to your stomach (nauseated).  Avoid alcoholic beverages for 24 hours or as instructed.  MEDICATIONS You may resume your normal medications unless your doctor tells you otherwise.  WHAT YOU CAN EXPECT TODAY Some feelings of bloating in the abdomen.  Passage of more gas than usual.  Spotting of blood in your stool or on the toilet paper.  IF YOU HAD POLYPS REMOVED DURING THE COLONOSCOPY: No aspirin products for 7 days or as instructed.  No alcohol for 7 days or as instructed.  Eat a soft diet for the next 24 hours.  FINDING OUT THE RESULTS OF YOUR TEST Not all test results  are available during your visit. If your test results are not back during the visit, make an appointment with your caregiver to find out the results. Do not assume everything is normal if you have not heard from your caregiver or the medical facility. It is important for you to follow up on all of your test results.  SEEK IMMEDIATE MEDICAL ATTENTION IF: You have more than a  spotting of blood in your stool.  Your belly is swollen (abdominal distention).  You are nauseated or vomiting.  You have a temperature over 101.  You have abdominal pain or discomfort that is severe or gets worse throughout the day.   Your EGD revealed mild amount inflammation in your stomach.  I took biopsies of this to rule out infection with a bacteria called H. pylori.  Await pathology results, my office will contact you.  You also had a tightening of your esophagus called a Schatzki's ring.  This is a benign ring related to acid reflux.  I stretched this out today.  Hopefully this helps with feeling of food getting stuck.  Continue on famotidine .   Your colonoscopy revealed 2 polyp(s) which I removed successfully. Await pathology results, my office will contact you. I recommend repeating colonoscopy in 5 years for surveillance purposes, and family history of colon cancer.   Otherwise follow up with GI in 3 months Message sent to [email or web address]    I hope you have a great rest of your week!  Carlin POUR. Cindie, D.O. Gastroenterology and Hepatology [facility]

## 2024-11-14 NOTE — Op Note (Signed)
 [facility] Patient Name: Mandy Garcia Procedure Date: 11/14/2024 10:32 AM MRN: [id number] Date of Birth: August 09, 1986 Attending MD: Carlin POUR. Cindie , [place], [id number] CSN: [id number] Age: 38 Admit Type: Outpatient Procedure:                Upper GI endoscopy Indications:              Dysphagia, Heartburn Providers:                Carlin POUR. Cindie, DO, Harlene Lips, Daphne Mulch Technician, Technician Referring MD:             Carlin POUR. Cindie, DO Medicines:                See the Anesthesia note for documentation of the                            administered medications Complications:            No immediate complications. Estimated Blood Loss:     Estimated blood loss was minimal. Procedure:                Pre-Anesthesia Assessment:                           - The anesthesia plan was to use monitored                            anesthesia care (MAC).                           After obtaining informed consent, the endoscope was                            passed under direct vision. Throughout the                            procedure, the patient's blood pressure, pulse, and                            oxygen saturations were monitored continuously. The                            [id number] ([id number]) Upper was introduced through                            the mouth, and advanced to the second part of                            duodenum. The upper GI endoscopy was accomplished                            without difficulty. The patient tolerated the                            procedure well. Scope  In: 10:47:50 AM Scope Out: 10:52:56 AM Total Procedure Duration: 0 hours 5 minutes 6 seconds  Findings:      A small hiatal hernia was present.      A mild Schatzki ring was found in the distal esophagus. A TTS dilator       was passed through the scope. Dilation with an 18-19-20 mm balloon       dilator was performed to 20 mm. The dilation site was  examined and       showed mild mucosal disruption and moderate improvement in luminal       narrowing.      Patchy mild inflammation was found in the gastric body and in the       gastric antrum. Biopsies were taken with a cold forceps for Helicobacter       pylori testing.      The duodenal bulb, first portion of the duodenum and second portion of       the duodenum were normal. Impression:               - Small hiatal hernia.                           - Mild Schatzki ring. Dilated.                           - Gastritis. Biopsied.                           - Normal duodenal bulb, first portion of the                            duodenum and second portion of the duodenum. Moderate Sedation:      Per Anesthesia Care Recommendation:           - Patient has a contact number available for                            emergencies. The signs and symptoms of potential                            delayed complications were discussed with the                            patient. Return to normal activities tomorrow.                            Written discharge instructions were provided to the                            patient.                           - Resume previous diet.                           - Continue present medications.                           - Await  pathology results.                           - Repeat upper endoscopy PRN for retreatment.                           - Return to GI clinic in 3 months. Procedure Code(s):        --- Professional ---                           [phone], Esophagogastroduodenoscopy, flexible,                            transoral; with transendoscopic balloon dilation of                            esophagus (less than 30 mm diameter)                           [id number], 59, Esophagogastroduodenoscopy, flexible,                            transoral; with biopsy, single or multiple Diagnosis Code(s):        --- Professional ---                           K44.9,  Diaphragmatic hernia without obstruction or                            gangrene                           K22.2, Esophageal obstruction                           K29.70, Gastritis, unspecified, without bleeding                           R13.10, Dysphagia, unspecified                           R12, Heartburn CPT copyright 2022 American Medical Association. All rights reserved. The codes documented in this report are preliminary and upon coder review may  be revised to meet current compliance requirements. Carlin POUR. Cindie, DO Carlin POUR. Cindie, DO 11/14/2024 10:55:24 AM This report has been signed electronically. Number of Addenda: 0

## 2024-11-14 NOTE — Anesthesia Postprocedure Evaluation (Signed)
 Anesthesia Post Note  Patient: Mandy Garcia  Procedure(s) Performed: COLONOSCOPY EGD (ESOPHAGOGASTRODUODENOSCOPY) DILATION, ESOPHAGUS POLYPECTOMY, INTESTINE  Patient location during evaluation: PACU Anesthesia Type: General Level of consciousness: awake and alert Pain management: pain level controlled Vital Signs Assessment: post-procedure vital signs reviewed and stable Respiratory status: spontaneous breathing, nonlabored ventilation, respiratory function stable and patient connected to nasal cannula oxygen Cardiovascular status: blood pressure returned to baseline and stable Postop Assessment: no apparent nausea or vomiting Anesthetic complications: no   No notable events documented.   Last Vitals:  Vitals:   11/14/24 0924 11/14/24 1115  BP:  103/69  Pulse: 60 69  Resp: 17 (!) 23  Temp: 36.6 C 36.9 C  SpO2: 96% 98%    Last Pain:  Vitals:   11/14/24 1115  TempSrc: Oral  PainSc: 0-No pain                 Andrea Limes

## 2024-11-14 NOTE — Op Note (Signed)
 [facility] Patient Name: Mandy Garcia Procedure Date: 11/14/2024 10:24 AM MRN: [id number] Date of Birth: 11/21/1986 Attending MD: Carlin POUR. Cindie HAS, [id number] CSN: [id number] Age: 38 Admit Type: Outpatient Procedure:                Colonoscopy Indications:              Screening in patient at increased risk: Colorectal                            cancer in father before age 92 Providers:                Carlin POUR. Cindie, DO, Harlene Lips, Daphne Mulch Technician, Technician Referring MD:             Carlin POUR. Cindie, DO Medicines:                See the Anesthesia note for documentation of the                            administered medications Complications:            No immediate complications. Estimated Blood Loss:     Estimated blood loss was minimal. Procedure:                Pre-Anesthesia Assessment:                           - The anesthesia plan was to use monitored                            anesthesia care (MAC).                           After obtaining informed consent, the colonoscope                            was passed under direct vision. Throughout the                            procedure, the patient's blood pressure, pulse, and                            oxygen saturations were monitored continuously. The                            PCF-HQ190L ([id number]) was introduced through the                            anus and advanced to the the cecum, identified by                            appendiceal orifice and ileocecal valve. The                            colonoscopy  was performed without difficulty. The                            patient tolerated the procedure well. The quality                            of the bowel preparation was evaluated using the                            BBPS [facility] Bowel Preparation Scale) with scores                            of: Right Colon = 2 (minor amount of residual                             staining, small fragments of stool and/or opaque                            liquid, but mucosa seen well), Transverse Colon = 2                            (minor amount of residual staining, small fragments                            of stool and/or opaque liquid, but mucosa seen                            well) and Left Colon = 3 (entire mucosa seen well                            with no residual staining, small fragments of stool                            or opaque liquid). The total BBPS score equals 7.                            The quality of the bowel preparation was good. Scope In: 10:57:22 AM Scope Out: 11:12:42 AM Scope Withdrawal Time: 0 hours 11 minutes 49 seconds  Total Procedure Duration: 0 hours 15 minutes 20 seconds  Findings:      A 5 mm polyp was found in the cecum. The polyp was sessile. The polyp       was removed with a cold snare. Resection and retrieval were complete.      A 5 mm polyp was found in the sigmoid colon. The polyp was sessile. The       polyp was removed with a cold snare. Resection and retrieval were       complete.      The exam was otherwise without abnormality. Impression:               - One 5 mm polyp in the cecum, removed with a cold  snare. Resected and retrieved.                           - One 5 mm polyp in the sigmoid colon, removed with                            a cold snare. Resected and retrieved.                           - The examination was otherwise normal. Moderate Sedation:      Per Anesthesia Care Recommendation:           - Patient has a contact number available for                            emergencies. The signs and symptoms of potential                            delayed complications were discussed with the                            patient. Return to normal activities tomorrow.                            Written discharge instructions were provided to the                             patient.                           - Resume previous diet.                           - Continue present medications.                           - Await pathology results.                           - Repeat colonoscopy in 5 years for surveillance                            and family history of colon cancer in father.                           - Return to GI clinic in 3 months. Procedure Code(s):        --- Professional ---                           [phone], Colonoscopy, flexible; with removal of                            tumor(s), polyp(s), or other lesion(s) by snare                            technique Diagnosis Code(s):        ---  Professional ---                           Z80.0, Family history of malignant neoplasm of                            digestive organs                           D12.0, Benign neoplasm of cecum                           D12.5, Benign neoplasm of sigmoid colon CPT copyright 2022 American Medical Association. All rights reserved. The codes documented in this report are preliminary and upon coder review may  be revised to meet current compliance requirements. Carlin POUR. Cindie, DO Carlin POUR. Cindie, DO 11/14/2024 11:16:34 AM This report has been signed electronically. Number of Addenda: 0

## 2024-11-14 NOTE — Transfer of Care (Signed)
 Immediate Anesthesia Transfer of Care Note  Patient: Mandy Garcia  Procedure(s) Performed: COLONOSCOPY EGD (ESOPHAGOGASTRODUODENOSCOPY) DILATION, ESOPHAGUS POLYPECTOMY, INTESTINE  Patient Location: Endoscopy Unit  Anesthesia Type:General  Level of Consciousness: awake, alert , oriented, and patient cooperative  Airway & Oxygen Therapy: Patient Spontanous Breathing  Post-op Assessment: Report given to RN and Post -op Vital signs reviewed and stable  Post vital signs: Reviewed and stable  Last Vitals:  Vitals Value Taken Time  BP 103/69 11/14/24 11:15  Temp 36.9 C 11/14/24 11:15  Pulse 69 11/14/24 11:15  Resp 23 11/14/24 11:15  SpO2 98 % 11/14/24 11:15    Last Pain:  Vitals:   11/14/24 1115  TempSrc: Oral  PainSc:       Patients Stated Pain Goal: 8 (11/14/24 0924)  Complications: No notable events documented.

## 2024-11-14 NOTE — Anesthesia Preprocedure Evaluation (Addendum)
 Anesthesia Evaluation  Patient identified by MRN, date of birth, ID band Patient awake    Reviewed: Allergy & Precautions, H&P , NPO status , Patient's Chart, lab work & pertinent test results  Airway Mallampati: II  TM Distance: >3 FB Neck ROM: Full    Dental no notable dental hx.    Pulmonary COPD, Current Smoker and Patient abstained from smoking. Hx bronchiectasis   Pulmonary exam normal breath sounds clear to auscultation       Cardiovascular negative cardio ROS Normal cardiovascular exam Rhythm:Regular Rate:Normal     Neuro/Psych   Anxiety     negative neurological ROS  negative psych ROS   GI/Hepatic Neg liver ROS,GERD  ,,  Endo/Other  negative endocrine ROS    Renal/GU negative Renal ROS  negative genitourinary   Musculoskeletal negative musculoskeletal ROS (+)    Abdominal   Peds negative pediatric ROS (+)  Hematology  (+) Blood dyscrasia, anemia   Anesthesia Other Findings   Reproductive/Obstetrics negative OB ROS                              Anesthesia Physical Anesthesia Plan  ASA: 2  Anesthesia Plan: General   Post-op Pain Management:    Induction: Intravenous  PONV Risk Score and Plan:   Airway Management Planned: Nasal Cannula  Additional Equipment:   Intra-op Plan:   Post-operative Plan:   Informed Consent: I have reviewed the patients History and Physical, chart, labs and discussed the procedure including the risks, benefits and alternatives for the proposed anesthesia with the patient or authorized representative who has indicated his/her understanding and acceptance.     Dental advisory given  Plan Discussed with: CRNA  Anesthesia Plan Comments:         Anesthesia Quick Evaluation

## 2024-11-15 ENCOUNTER — Encounter (HOSPITAL_COMMUNITY): Payer: Self-pay | Admitting: Internal Medicine

## 2024-11-15 LAB — SURGICAL PATHOLOGY

## 2025-03-02 ENCOUNTER — Ambulatory Visit: Payer: Self-pay | Admitting: Family
